# Patient Record
Sex: Female | Born: 2000
Health system: Southern US, Community
[De-identification: ages and names within clinical notes are randomized; demographics above are authoritative.]

## PROBLEM LIST (undated history)

## (undated) DIAGNOSIS — G43909 Migraine, unspecified, not intractable, without status migrainosus: Secondary | ICD-10-CM

## (undated) HISTORY — PX: NO PAST SURGERIES: SHX2092

## (undated) HISTORY — DX: Migraine, unspecified, not intractable, without status migrainosus: G43.909

---
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
## 2018-03-05 ENCOUNTER — Ambulatory Visit: Payer: 59 | Admitting: Neurology

## 2018-03-23 ENCOUNTER — Encounter

## 2018-03-23 ENCOUNTER — Encounter: Payer: Self-pay | Admitting: Neurology

## 2018-03-23 ENCOUNTER — Ambulatory Visit: Payer: 59 | Admitting: Neurology

## 2018-03-23 VITALS — BP 105/63 | HR 61 | Wt 182.0 lb

## 2018-03-23 DIAGNOSIS — R202 Paresthesia of skin: Secondary | ICD-10-CM | POA: Diagnosis not present

## 2018-03-23 NOTE — Progress Notes (Signed)
Reason for visit: Numbness  Referring physician: Dr. Welton Flakes is a 17 y.o. female  History of present illness:  Ms. Turnbaugh is an 17 year old right-handed white female with a history of numbness mainly involving the big toes of both feet that began simultaneously in August 2018.  The patient has had persistent sensory symptoms but the symptoms will wax and wane in severity.  The patient indicates that there is no discomfort with the numbness, she denies any difficulty sleeping at night.  She reports no change in balance and has had no change in strength of the legs.  She reports no numbness elsewhere on the legs or body or arms or hands.  She denies any balance changes or difficulty controlling the bowels or the bladder.  She does not believe that the overall severity of the numbness has worsened over time.  The patient has no family history of peripheral neuropathy issues.  She is followed by Dr. Catalina Lunger for intractable migraine headache.  She reports no association of the numbness in the feet with the migraine.  Past Medical History:  Diagnosis Date  . Migraine     Past Surgical History:  Procedure Laterality Date  . NO PAST SURGERIES      History reviewed. No pertinent family history.  Social history:  has no tobacco, alcohol, and drug history on file.  Medications:  Prior to Admission medications   Medication Sig Start Date End Date Taking? Authorizing Provider  atenolol (TENORMIN) 25 MG tablet Take 25 mg by mouth daily.    Yes [provider]  Metaxalone (SKELAXIN PO) Skelaxin   Yes [provider]  norethindrone-ethinyl estradiol (JUNEL 1/20) 1-20 MG-MCG tablet Junel FE 1/20 (28) 1 mg-20 mcg (21)/75 mg (7) tablet  Take 1 tablet every day by oral route for 28 days.  Can take continuously   Yes [provider]  rizatriptan (MAXALT) 10 MG tablet Take 1 tablet by mouth as needed.   Yes [provider]     No Known  Allergies  ROS:  Out of a complete 14 system review of symptoms, the patient complains only of the following symptoms, and all other reviewed systems are negative.  Numbness  Blood pressure 105/63, pulse 61, weight 182 lb (82.6 kg).  Physical Exam  General: The patient is alert and cooperative at the time of the examination.  Eyes: Pupils are equal, round, and reactive to light. Discs are flat bilaterally.  Good venous pulsations are noted.  Neck: The neck is supple, no carotid bruits are noted.  Respiratory: The respiratory examination is clear.  Cardiovascular: The cardiovascular examination reveals a regular rate and rhythm, no obvious murmurs or rubs are noted.  Skin: Extremities are without significant edema.  Neurologic Exam  Mental status: The patient is alert and oriented x 3 at the time of the examination. The patient has apparent normal recent and remote memory, with an apparently normal attention span and concentration ability.  Cranial nerves: Facial symmetry is present. There is good sensation of the face to pinprick and soft touch bilaterally. The strength of the facial muscles and the muscles to head turning and shoulder shrug are normal bilaterally. Speech is well enunciated, no aphasia or dysarthria is noted. Extraocular movements are full. Visual fields are full. The tongue is midline, and the patient has symmetric elevation of the soft palate. No obvious hearing deficits are noted.  Motor: The motor testing reveals 5 over 5 strength of all 4  extremities. Good symmetric motor tone is noted throughout.  Sensory: Sensory testing is intact to pinprick, soft touch, vibration sensation, and position sense on all 4 extremities, with exception of that there may be a slight stocking pattern sensory change in the distal half of the foot bilaterally. No evidence of extinction is noted.  Coordination: Cerebellar testing reveals good finger-nose-finger and heel-to-shin  bilaterally.  Gait and station: Gait is normal. Tandem gait is normal. Romberg is negative. No drift is seen.  The patient is able to walk on the heels and the toes bilaterally.  Reflexes: Deep tendon reflexes are symmetric and normal bilaterally, with exception that the ankle jerk reflexes may be slightly depressed bilaterally. Toes are downgoing bilaterally.   Assessment/Plan:  1.  Numbness of the toes bilaterally  The patient has isolated numbness of the great toes bilaterally.  Clinical examination is otherwise relatively unremarkable.  The patient will be set up for blood work today, she will have nerve conduction studies done on both legs and EMG on one leg.  She will follow-up for the above study.  If the results of the above evaluation are normal, conservative monitoring of the sensory deficit will be done.  Marlan Palau. Keith Willis MD 03/23/2018 10:03 AM  [facility] [address] [place], [place] [address]  Phone [phone] Fax [phone]

## 2018-03-23 NOTE — Patient Instructions (Signed)
We will get EMG and NCV study.

## 2018-03-25 ENCOUNTER — Telehealth: Payer: Self-pay | Admitting: Neurology

## 2018-03-25 LAB — MULTIPLE MYELOMA PANEL, SERUM
ALBUMIN SERPL ELPH-MCNC: 3.5 g/dL (ref 2.9–4.4)
ALPHA 1: 0.3 g/dL (ref 0.0–0.4)
ALPHA2 GLOB SERPL ELPH-MCNC: 0.9 g/dL (ref 0.4–1.0)
Albumin/Glob SerPl: 1.1 (ref 0.7–1.7)
B-GLOBULIN SERPL ELPH-MCNC: 1.1 g/dL (ref 0.7–1.3)
GLOBULIN, TOTAL: 3.3 g/dL (ref 2.2–3.9)
Gamma Glob SerPl Elph-Mcnc: 1 g/dL (ref 0.4–1.8)
IGG (IMMUNOGLOBIN G), SERUM: 898 mg/dL (ref 549–1584)
IGM (IMMUNOGLOBULIN M), SRM: 141 mg/dL (ref 58–230)
IgA/Immunoglobulin A, Serum: 88 mg/dL (ref 87–352)
Total Protein: 6.8 g/dL (ref 6.0–8.5)

## 2018-03-25 LAB — RHEUMATOID FACTOR

## 2018-03-25 LAB — B. BURGDORFI ANTIBODIES: Lyme IgG/IgM Ab: <0.91 {ISR} (ref 0.00–0.90)

## 2018-03-25 LAB — HIV ANTIBODY (ROUTINE TESTING W REFLEX): HIV Screen 4th Generation wRfx: NONREACTIVE

## 2018-03-25 LAB — ANGIOTENSIN CONVERTING ENZYME: Angio Convert Enzyme: 25 U/L (ref 14–82)

## 2018-03-25 LAB — ANA W/REFLEX: ANA: NEGATIVE

## 2018-03-25 LAB — VITAMIN B12: Vitamin B-12: 522 pg/mL (ref 232–1245)

## 2018-03-25 NOTE — Telephone Encounter (Signed)
Pt mother(on DPR) is asking for a call with blood work results when available please

## 2018-03-26 ENCOUNTER — Telehealth: Payer: Self-pay | Admitting: *Deleted

## 2018-03-26 NOTE — Telephone Encounter (Signed)
Called mother, Aggie Cosierheresa ([place]) about unremarkable labs per CW,MD note. She verbalized understanding and appreciation for all.

## 2018-03-26 NOTE — Telephone Encounter (Signed)
-----   Message from York Spanielharles K Willis, MD sent at 03/25/2018  4:42 PM EDT -----   The blood work results are unremarkable. Please call the patient. Please call the mother with the results. ----- Message ----- From: Nell RangeInterface, [facility] Lab Results In Sent: 03/24/2018   7:41 AM To: York Spanielharles K Willis, MD

## 2018-04-21 ENCOUNTER — Ambulatory Visit: Payer: 59 | Admitting: Neurology

## 2018-04-21 ENCOUNTER — Ambulatory Visit (INDEPENDENT_AMBULATORY_CARE_PROVIDER_SITE_OTHER): Payer: 59 | Admitting: Neurology

## 2018-04-21 ENCOUNTER — Encounter: Payer: Self-pay | Admitting: Neurology

## 2018-04-21 DIAGNOSIS — R202 Paresthesia of skin: Secondary | ICD-10-CM | POA: Diagnosis not present

## 2018-04-21 NOTE — Procedures (Signed)
     HISTORY:  Wendy Chang is a 17 year old patient with a history of numbness in the big toes bilaterally.  The patient denies any back pain or pain down the legs.  She is being evaluated for a possible neuropathy.  NERVE CONDUCTION STUDIES:  Nerve conduction studies were performed on both lower extremities. The distal motor latencies and motor amplitudes for the peroneal and posterior tibial nerves were within normal limits. The nerve conduction velocities for these nerves were also normal. The sensory latencies for the peroneal and sural nerves were within normal limits. The F wave latencies for the posterior tibial nerves were within normal limits.   EMG STUDIES:  EMG study was performed on the right lower extremity:  The tibialis anterior muscle reveals 2 to 4K motor units with full recruitment. No fibrillations or positive waves were seen. The peroneus tertius muscle reveals 2 to 4K motor units with full recruitment. No fibrillations or positive waves were seen. The medial gastrocnemius muscle reveals 1 to 3K motor units with full recruitment. No fibrillations or positive waves were seen. The vastus lateralis muscle reveals 2 to 4K motor units with full recruitment. No fibrillations or positive waves were seen. The iliopsoas muscle reveals 2 to 4K motor units with full recruitment. No fibrillations or positive waves were seen. The biceps femoris muscle (long head) reveals 2 to 4K motor units with full recruitment. No fibrillations or positive waves were seen. The lumbosacral paraspinal muscles were tested at 3 levels, and revealed no abnormalities of insertional activity at all 3 levels tested. There was good relaxation.  IMPRESSION:  Nerve conduction studies done on both lower extremities were within normal limits.  No evidence of a neuropathy is seen.  EMG evaluation of the right lower extremity was normal, no evidence of a lumbar radiculopathy is seen.  Wendy Chang. Keith Javana Schey  MD 04/21/2018 4:31 PM  [facility] [address] [place], [place] [address]  Phone [phone] Fax [phone]

## 2018-04-21 NOTE — Progress Notes (Addendum)
The patient comes in today for EMG nerve conduction study evaluation.  The nerve conduction studies were normal, EMG on the right leg was normal.  The patient will monitor her sensory symptoms, if any changes are noted, she is to contact our office.   MNC    Nerve / Sites Muscle Latency Ref. Amplitude Ref. Rel Amp Segments Distance Velocity Ref. Area    ms ms mV mV %  cm m/s m/s mVms  R Peroneal - EDB     Ankle EDB 3.8 ?6.5 6.6 ?2.0 100 Ankle - EDB 9   21.6     Fib head EDB 9.6  5.9  89.4 Fib head - Ankle 32 55 ?44 19.9     Pop fossa EDB 11.6  5.3  90.4 Pop fossa - Fib head 10 51 ?44 18.4         Pop fossa - Ankle      L Peroneal - EDB     Ankle EDB 3.8 ?6.5 7.8 ?2.0 100 Ankle - EDB 9   25.0     Fib head EDB 9.7  7.5  95.7 Fib head - Ankle 32 54 ?44 24.2     Pop fossa EDB 11.4  8.4  112 Pop fossa - Fib head 10 58 ?44 29.3     Acc Peron EDB 4.7  0.3  3.26 Pop fossa - Ankle    1.1         Acc Peron - Pop fossa      R Tibial - AH     Ankle AH 3.5 ?5.8 23.5 ?4.0 100 Ankle - AH 9   41.2     Pop fossa AH 11.7  20.0  85 Pop fossa - Ankle 37 46 ?41 39.4  L Tibial - AH     Ankle AH 3.4 ?5.8 22.7 ?4.0 100 Ankle - AH 9   44.6     Pop fossa AH 11.5  18.1  79.7 Pop fossa - Ankle 37 46 ?41 39.5             SNC    Nerve / Sites Rec. Site Peak Lat Ref.  Amp Ref. Segments Distance    ms ms V V  cm  R Sural - Ankle (Calf)     Calf Ankle 3.2 ?4.4 13 ?6 Calf - Ankle 14  L Sural - Ankle (Calf)     Calf Ankle 3.4 ?4.4 12 ?6 Calf - Ankle 14  R Superficial peroneal - Ankle     Lat leg Ankle 3.3 ?4.4 13 ?6 Lat leg - Ankle 14  L Superficial peroneal - Ankle     Lat leg Ankle 3.4 ?4.4 12 ?6 Lat leg - Ankle 14             F  Wave    Nerve F Lat Ref.   ms ms  R Tibial - AH 44.1 ?56.0  L Tibial - AH 43.8 ?56.0         EMG full

## 2018-04-21 NOTE — Progress Notes (Signed)
Please refer to EMG and nerve conduction study procedure note. 

## 2018-06-30 DIAGNOSIS — R69 Illness, unspecified: Secondary | ICD-10-CM | POA: Diagnosis not present

## 2018-07-06 DIAGNOSIS — Z23 Encounter for immunization: Secondary | ICD-10-CM | POA: Diagnosis not present

## 2018-07-12 DIAGNOSIS — R69 Illness, unspecified: Secondary | ICD-10-CM | POA: Diagnosis not present

## 2018-07-18 DIAGNOSIS — R1110 Vomiting, unspecified: Secondary | ICD-10-CM | POA: Diagnosis not present

## 2018-07-18 DIAGNOSIS — G43709 Chronic migraine without aura, not intractable, without status migrainosus: Secondary | ICD-10-CM | POA: Diagnosis not present

## 2018-07-18 DIAGNOSIS — G43909 Migraine, unspecified, not intractable, without status migrainosus: Secondary | ICD-10-CM | POA: Diagnosis not present

## 2018-10-19 DIAGNOSIS — J101 Influenza due to other identified influenza virus with other respiratory manifestations: Secondary | ICD-10-CM | POA: Diagnosis not present

## 2019-10-21 HISTORY — PX: APPENDECTOMY: SHX54

## 2021-11-19 NOTE — Progress Notes (Signed)
[facility]    Provider:  Dr Lucia Gaskins Requesting Provider: Isabella Bowens, [place]* Primary Care Provider:  Marcene Corning, MD  CC:  migraines  HPI:  Wendy Chang is a 21 y.o. female here as requested by Isabella Bowens, PA* for migraines. She saw my colleague Dr. Anne Hahn for numbness of the big toe but that over 3 years ago. Today here for migraines. She has seen neurology Anastasia Fiedler Np in the past at [facility]. I reviewed her notes and medications patient has tried before(see below)  She has had headaches "forever" since the age of 31 or so, Gotten worse over the years. She was on multiple medications. They can be bilaterally or unilateral, pulsating/pounding/thrpobbing/light and sound sensitivity, nausea and vomiting. She also has some less severe headaches that are more tension type but the majority are migraines and moderate to severe 15 days a month and daily headaches. She takes [place] which helps, Rizatriptan helps, trudhessa(does not like side effects), more cervical tightness, cervical myofascial pain, she does not have an aura. No other focal neurologic deficits, associated symptoms, inciting events or modifiable factors.  Reviewed notes, labs and imaging from outside physicians, which showed:  Current and past medications: ANALGESICS: excedrin ANTI-MIGRAINE: imitrex, Maxalt, Ubrelvy HEART/BP: Atenolol (helpful) DECONGESTANT/ANTIHISTAMINE: Benadryl,Sudafed ANTI-NAUSEANT: Zofran NSAIDS:Advil MUSCLE RELAXANTS: Robaxin (somewhat helpful), Zanaflex (no effect), Baclofen (no effect), Skelaxin ANTI-CONVULSANTS: topamax (SE), Zonegran (SE, mood), gabapentin (no help) STEROIDS: Prednisone SLEEPING PILLS/TRANQUILIZERS: ANTI-DEPRESSANTS: imipramine (SE) HERBAL: Mg, B2 FIBROMYALGIA:  HORMONAL: OTHER: Aimovig (not helpful) PROCEDURES FOR HEADACHES:  Review of Systems: Patient complains of symptoms per HPI as well as the following symptoms migraines.  Pertinent negatives and positives per HPI. All others negative.   Social History   Socioeconomic History   Marital status: Single    Spouse name: Not on file   Number of children: 0   Years of education: in college   Highest education level: Not on file  Occupational History   Occupation: Consulting civil engineer  Tobacco Use   Smoking status: Never   Smokeless tobacco: Not on file  Vaping Use   Vaping Use: Never used  Substance and Sexual Activity   Alcohol use: Never   Drug use: Never   Sexual activity: Not on file  Other Topics Concern   Not on file  Social History Narrative   Lives w/ mother and stepdad   Caffeine use: no caffeine    Right handed    Social Determinants of Health   Financial Resource Strain: Not on file  Food Insecurity: Not on file  Transportation Needs: Not on file  Physical Activity: Not on file  Stress: Not on file  Social Connections: Not on file  Intimate Partner Violence: Not on file    Family History  Problem Relation Age of Onset   Migraines Mother    Migraines Maternal Grandmother     Past Medical History:  Diagnosis Date   Migraine     Patient Active Problem List   Diagnosis Date Noted   Intractable chronic migraine without aura and without status migrainosus 11/20/2021    Past Surgical History:  Procedure Laterality Date   NO PAST SURGERIES      Current Outpatient Medications  Medication Sig Dispense Refill   amitriptyline (ELAVIL) 10 MG tablet 20 mg.     Fremanezumab-vfrm (AJOVY) 225 MG/1.5ML SOAJ Inject 225 mg into the skin every 30 (thirty) days. 1.5 mL 11   Fremanezumab-vfrm (AJOVY) 225 MG/1.5ML SOAJ Inject 225 mg into the skin every  30 (thirty) days. 1.5 mL 11   Metaxalone (SKELAXIN PO) Skelaxin     norethindrone-ethinyl estradiol (LOESTRIN) 1-20 MG-MCG tablet Junel FE 1/20 (28) 1 mg-20 mcg (21)/75 mg (7) tablet  Take 1 tablet every day by oral route for 28 days.  Can take continuously     Ubrogepant (UBRELVY) 100 MG TABS Take  100 mg by mouth every 2 (two) hours as needed. Maximum 200mg  a day. 16 tablet 11   rizatriptan (MAXALT) 10 MG tablet Take 1 tablet (10 mg total) by mouth as needed. 9 tablet 11   No current facility-administered medications for this visit.    Allergies as of 11/20/2021   (No Known Allergies)    Vitals: BP 112/84    Pulse 98    Ht 5\' 5"  (1.651 m)    Wt 177 lb (80.3 kg)    SpO2 95%    BMI 29.45 kg/m  Last Weight:  Wt Readings from Last 1 Encounters:  11/20/21 177 lb (80.3 kg)   Last Height:   Ht Readings from Last 1 Encounters:  11/20/21 5\' 5"  (1.651 m)     Physical exam: Exam: Gen: NAD, conversant, well nourised, well groomed                     CV: RRR, no MRG. No Carotid Bruits. No peripheral edema, warm, nontender Eyes: Conjunctivae clear without exudates or hemorrhage  Neuro: Detailed Neurologic Exam  Speech:    Speech is normal; fluent and spontaneous with normal comprehension.  Cognition:    The patient is oriented to person, place, and time;     recent and remote memory intact;     language fluent;     normal attention, concentration,     fund of knowledge Cranial Nerves:    The pupils are equal, round, and reactive to light. The fundi are flat. Visual fields are full to finger confrontation. Extraocular movements are intact. Trigeminal sensation is intact and the muscles of mastication are normal. The face is symmetric. The palate elevates in the midline. Hearing intact. Voice is normal. Shoulder shrug is normal. The tongue has normal motion without fasciculations.   Coordination:    Normal.   Gait:    normal.   Motor Observation:    No asymmetry, no atrophy, and no involuntary movements noted. Tone:    Normal muscle tone.    Posture:    Posture is normal. normal erect    Strength:    Strength is V/V in the upper and lower limbs.      Sensation: intact to LT     Reflex Exam:  DTR's:    Deep tendon reflexes in the upper and lower extremities are  normal bilaterally.   Toes:    The toes are downgoing bilaterally.   Clonus:    Clonus is absent.    Assessment/Plan: This is a 21 year old patient with chronic migraines.  She is tried and failed multiple medications.  We discussed options today extensively, including trying another CGRP medication such as Ajovy or trying Botox.  We discussed the pros and cons.  She was on Aimovig for more than 6 months per patient and was doing well however it stopped working.  -No red flags to get imaging for MRI of the brain, these are her usual migraines, no changes in quality or severity or frequency, ongoing for over a year and since she was 21 years old.  Not positional or exertional.  No vision changes.  We will hold off on MRI of the brain, I discussed with patient and she does not want to get an MRI of the brain, but if anything changes low threshold for MRI.  -Decided to try Ajovy, gave her 2 samples to get started, will see if we can get it approved through her insurance or [facility] if that is more preferred.  Discussed teratogenicity, do not get pregnant on this medication or for 6 months after stopping it.  Use birth control and backup.  Patient acknowledged risks.  She is not intending on getting pregnant anytime soon and she does use birth control consistently.  -Would try Botox next  -Ubrelvy and Maxalt work acutely, refilled.  Meds ordered this encounter  Medications   rizatriptan (MAXALT) 10 MG tablet    Sig: Take 1 tablet (10 mg total) by mouth as needed.    Dispense:  9 tablet    Refill:  11   Ubrogepant (UBRELVY) 100 MG TABS    Sig: Take 100 mg by mouth every 2 (two) hours as needed. Maximum 200mg  a day.    Dispense:  16 tablet    Refill:  11   Fremanezumab-vfrm (AJOVY) 225 MG/1.5ML SOAJ    Sig: Inject 225 mg into the skin every 30 (thirty) days.    Dispense:  1.5 mL    Refill:  11    [id number] 04/2024   Fremanezumab-vfrm (AJOVY) 225 MG/1.5ML SOAJ    Sig: Inject 225 mg  into the skin every 30 (thirty) days.    Dispense:  1.5 mL    Refill:  11    Cc: Isabella BowensWalke, Devyn Leeann, PA*,  Marcene Corningwiselton, Louise, MD  Naomie DeanAntonia Francely Craw, MD  [facility] [address] [place], [place] [id number]  Phone [phone] Fax [phone]  I spent over 60 minutes of face-to-face and non-face-to-face time with patient on the  1. Intractable chronic migraine without aura and without status migrainosus    diagnosis.  This included previsit chart review, lab review, study review, order entry, electronic health record documentation, patient education on the different diagnostic and therapeutic options, counseling and coordination of care, risks and benefits of management, compliance, or risk factor reduction

## 2021-11-20 ENCOUNTER — Ambulatory Visit (INDEPENDENT_AMBULATORY_CARE_PROVIDER_SITE_OTHER): Payer: No Typology Code available for payment source | Admitting: Neurology

## 2021-11-20 ENCOUNTER — Encounter: Payer: Self-pay | Admitting: Neurology

## 2021-11-20 ENCOUNTER — Other Ambulatory Visit: Payer: Self-pay

## 2021-11-20 VITALS — BP 112/84 | HR 98 | Ht 65.0 in | Wt 177.0 lb

## 2021-11-20 DIAGNOSIS — G43719 Chronic migraine without aura, intractable, without status migrainosus: Secondary | ICD-10-CM

## 2021-11-20 MED ORDER — AJOVY 225 MG/1.5ML ~~LOC~~ SOAJ: 225.0000 mg | SUBCUTANEOUS | 11 refills | Status: DC

## 2021-11-20 MED ORDER — UBRELVY 100 MG PO TABS
100.0000 mg | ORAL_TABLET | ORAL | 11 refills | Status: DC | PRN
Start: 2021-11-20 — End: 2021-12-30

## 2021-11-20 MED ORDER — RIZATRIPTAN BENZOATE 10 MG PO TABS: 10.0000 mg | ORAL_TABLET | ORAL | 11 refills | Status: DC | PRN

## 2021-11-20 NOTE — Patient Instructions (Signed)
Start Ajovy Discussed Botox Continue Ubrelvy and Maxalt/Rizatriptan as needed  Ubrogepant tablets What is this medication? UBROGEPANT (ue BROE je pant) is used to treat migraine headaches with or without aura. An aura is a strange feeling or visual disturbance that warns you of an attack. It is not used to prevent migraines. This medicine may be used for other purposes; ask your health care provider or pharmacist if you have questions. COMMON BRAND NAME(S): Roselyn Meier What should I tell my care team before I take this medication? They need to know if you have any of these conditions: kidney disease liver disease an unusual or allergic reaction to ubrogepant, other medicines, foods, dyes, or preservatives pregnant or trying to get pregnant breast-feeding How should I use this medication? Take this medicine by mouth with a glass of water. Follow the directions on the prescription label. You can take it with or without food. If it upsets your stomach, take it with food. Take your medicine at regular intervals. Do not take it more often than directed. Do not stop taking except on your doctor's advice. Talk to your pediatrician about the use of this medicine in children. Special care may be needed. Overdosage: If you think you have taken too much of this medicine contact a poison control center or emergency room at once. NOTE: This medicine is only for you. Do not share this medicine with others. What if I miss a dose? This does not apply. This medicine is not for regular use. What may interact with this medication? Do not take this medicine with any of the following medicines: ceritinib certain antibiotics like chloramphenicol, clarithromycin, telithromycin certain antivirals for HIV like atazanavir, cobicistat, darunavir, delavirdine, fosamprenavir, indinavir, ritonavir certain medicines for fungal infections like itraconazole, ketoconazole, posaconazole,  voriconazole conivaptan grapefruit idelalisib mifepristone nefazodone ribociclib This medicine may also interact with the following medications: carvedilol certain medicines for seizures like phenobarbital, phenytoin ciprofloxacin cyclosporine eltrombopag fluconazole fluvoxamine quinidine rifampin St. John's wort verapamil This list may not describe all possible interactions. Give your health care provider a list of all the medicines, herbs, non-prescription drugs, or dietary supplements you use. Also tell them if you smoke, drink alcohol, or use illegal drugs. Some items may interact with your medicine. What should I watch for while using this medication? Visit your health care professional for regular checks on your progress. Tell your health care professional if your symptoms do not start to get better or if they get worse. Your mouth may get dry. Chewing sugarless gum or sucking hard candy and drinking plenty of water may help. Contact your health care professional if the problem does not go away or is severe. What side effects may I notice from receiving this medication? Side effects that you should report to your doctor or health care professional as soon as possible: allergic reactions like skin rash, itching or hives; swelling of the face, lips, or tongue Side effects that usually do not require medical attention (report these to your doctor or health care professional if they continue or are bothersome): drowsiness dry mouth nausea tiredness This list may not describe all possible side effects. Call your doctor for medical advice about side effects. You may report side effects to FDA at 1-800-FDA-1088. Where should I keep my medication? Keep out of the reach of children. Store at room temperature between 15 and 30 degrees C (59 and 86 degrees F). Throw away any unused medicine after the expiration date. NOTE: This sheet is a summary. It may  not cover all possible  information. If you have questions about this medicine, talk to your doctor, pharmacist, or health care provider.  2022 Elsevier/Gold Standard (2018-12-23 00:00:00) OnabotulinumtoxinA injection (Medical Use) What is this medication? ONABOTULINUMTOXINA (o na BOTT you lye num tox in eh) is a neuro-muscular blocker. This medicine is used to treat crossed eyes, eyelid spasms, severe neck muscle spasms, ankle and toe muscle spasms, and elbow, wrist, and finger muscle spasms. It is also used to treat excessive underarm sweating, to prevent chronic migraine headaches, and to treat loss of bladder control due to neurologic conditions such as multiple sclerosis or spinal cord injury. This medicine may be used for other purposes; ask your health care provider or pharmacist if you have questions. COMMON BRAND NAME(S): Botox What should I tell my care team before I take this medication? They need to know if you have any of these conditions: breathing problems cerebral palsy spasms difficulty urinating heart problems history of surgery where this medicine is going to be used infection at the site where this medicine is going to be used myasthenia gravis or other neurologic disease nerve or muscle disease surgery plans take medicines that treat or prevent blood clots thyroid problems an unusual or allergic reaction to botulinum toxin, albumin, other medicines, foods, dyes, or preservatives pregnant or trying to get pregnant breast-feeding How should I use this medication? This medicine is for injection into a muscle. It is given by a health care professional in a hospital or clinic setting. Talk to your pediatrician regarding the use of this medicine in children. While this drug may be prescribed for children as young as 13 years old for selected conditions, precautions do apply. Overdosage: If you think you have taken too much of this medicine contact a poison control center or emergency room at  once. NOTE: This medicine is only for you. Do not share this medicine with others. What if I miss a dose? This does not apply. What may interact with this medication? aminoglycoside antibiotics like gentamicin, neomycin, tobramycin muscle relaxants other botulinum toxin injections This list may not describe all possible interactions. Give your health care provider a list of all the medicines, herbs, non-prescription drugs, or dietary supplements you use. Also tell them if you smoke, drink alcohol, or use illegal drugs. Some items may interact with your medicine. What should I watch for while using this medication? Visit your doctor for regular check ups. This medicine will cause weakness in the muscle where it is injected. Tell your doctor if you feel unusually weak in other muscles. Get medical help right away if you have problems with breathing, swallowing, or talking. This medicine might make your eyelids droop or make you see blurry or double. If you have weak muscles or trouble seeing do not drive a car, use machinery, or do other dangerous activities. This medicine contains albumin from human blood. It may be possible to pass an infection in this medicine, but no cases have been reported. Talk to your doctor about the risks and benefits of this medicine. If your activities have been limited by your condition, go back to your regular routine slowly after treatment with this medicine. What side effects may I notice from receiving this medication? Side effects that you should report to your doctor or health care professional as soon as possible: allergic reactions like skin rash, itching or hives, swelling of the face, lips, or tongue breathing problems changes in vision chest pain or tightness eye irritation, pain  fast, irregular heartbeat infection numbness speech problems swallowing problems unusual weakness Side effects that usually do not require medical attention (report to your  doctor or health care professional if they continue or are bothersome): bruising or pain at site where injected drooping eyelid dry eyes or mouth headache muscles aches, pains sensitivity to light tearing This list may not describe all possible side effects. Call your doctor for medical advice about side effects. You may report side effects to FDA at 1-800-FDA-1088. Where should I keep my medication? This drug is given in a hospital or clinic and will not be stored at home. NOTE: This sheet is a summary. It may not cover all possible information. If you have questions about this medicine, talk to your doctor, pharmacist, or health care provider.  2022 Elsevier/Gold Standard (2018-04-19 00:00:00)

## 2021-11-26 ENCOUNTER — Telehealth: Payer: Self-pay | Admitting: Neurology

## 2021-11-26 NOTE — Telephone Encounter (Signed)
PA completed on [facility] [facility] [id number] Approved immediately  CaseId:[id number];Status:Approved;Review Type:Prior Auth;Coverage Start Date:10/27/2021;Coverage End Date:11/26/2022;

## 2021-12-30 ENCOUNTER — Telehealth: Payer: Self-pay | Admitting: Neurology

## 2021-12-30 MED ORDER — UBRELVY 100 MG PO TABS: 100.0000 mg | ORAL_TABLET | ORAL | 11 refills | Status: DC | PRN

## 2021-12-30 NOTE — Addendum Note (Signed)
Addended by: Bertram Savin on: 12/30/2021 09:39 AM ? ? Modules accepted: Orders ? ?

## 2021-12-30 NOTE — Telephone Encounter (Signed)
Spoke with patient. Will try [facility]. Pt was given the number for the pharmacy if she doesn't hear from them within 24 hours. Pt verbalized appreciation.  ? ?Wendy Chang Rx e-scribed to [facility].  ?

## 2021-12-30 NOTE — Telephone Encounter (Signed)
Pt has called to report that [facility] [phone] is only able to get the Ubrogepant (UBRELVY) 100 MG TABS in 10 pill increments and unable to break up, they are asking if Dr Lucia Gaskins will allow 20 to be called in instead of 16  ?

## 2021-12-30 NOTE — Telephone Encounter (Signed)
[facility] pharmacy can likely give her the 16 tablets. Insurance will not approve 20 tablets of Ubrelvy.  ?

## 2022-01-23 ENCOUNTER — Other Ambulatory Visit: Payer: Self-pay | Admitting: Neurology

## 2022-01-23 DIAGNOSIS — G43719 Chronic migraine without aura, intractable, without status migrainosus: Secondary | ICD-10-CM

## 2022-03-10 ENCOUNTER — Telehealth: Payer: Self-pay

## 2022-03-10 MED ORDER — AMITRIPTYLINE HCL 10 MG PO TABS: 20.0000 mg | ORAL_TABLET | Freq: Every day | ORAL | 5 refills | Status: DC

## 2022-03-10 NOTE — Telephone Encounter (Signed)
Patient left a voicemail this morning asking for a refill on an unnamed medication.  Please call patient.

## 2022-03-10 NOTE — Telephone Encounter (Signed)
Per Dr Lucia Gaskins, ok to refill the Amitriptyline.

## 2022-03-10 NOTE — Telephone Encounter (Signed)
Spoke with the patient.  She states she was referring to Amitriptyline.  She was getting this previously from another physician before she saw Dr. Jaynee Eagles.  She takes two 10 mg tablets at night for total of 20 mg.  She states she is afraid to come off of it at this time and would like to continue it.

## 2022-03-11 ENCOUNTER — Telehealth: Payer: Self-pay | Admitting: *Deleted

## 2022-03-11 NOTE — Telephone Encounter (Signed)
Completed Bernita Raisin PA on Cover [employer] Med[employer]. Key: [id number] - PA Case ID: [id number] - Rx #: [id number]  Approved today by [facility] CaseId:[id number];Status:Approved;Review Type:Prior Auth;Coverage Start Date:02/09/2022;Coverage End Date:03/11/2023;  [facility] pharmacy has been notified via fax. Received a receipt of confirmation.

## 2022-05-12 ENCOUNTER — Encounter: Payer: Self-pay | Admitting: Neurology

## 2022-05-12 DIAGNOSIS — G43719 Chronic migraine without aura, intractable, without status migrainosus: Secondary | ICD-10-CM

## 2022-05-13 MED ORDER — EMGALITY 120 MG/ML ~~LOC~~ SOAJ: 120.0000 mg | SUBCUTANEOUS | 5 refills | Status: DC

## 2022-05-14 ENCOUNTER — Other Ambulatory Visit: Payer: Self-pay | Admitting: *Deleted

## 2022-05-14 ENCOUNTER — Telehealth: Payer: Self-pay | Admitting: *Deleted

## 2022-05-14 NOTE — Telephone Encounter (Signed)
Completed Emgality PA on Cover My Meds. Key: [id number]. Approved immediately.  CaseId: [id number]; Status: Approved; Review Type: Prior Auth; Coverage Start Date: 04/14/2022; Coverage End Date: 05/14/2023;

## 2022-08-21 ENCOUNTER — Encounter: Payer: Self-pay | Admitting: Neurology

## 2022-08-21 NOTE — Telephone Encounter (Signed)
I called [facility] pharmacy. I was told the patient had maxed out her savings card. I called the pt and advised her to download a new savings card and give that info to the pharmacy then let us know if it doesn't work. Pt verbalized appreciation and her questions were answered.

## 2022-08-27 MED ORDER — UBRELVY 100 MG PO TABS: 100.0000 mg | ORAL_TABLET | ORAL | 11 refills | Status: DC | PRN

## 2022-08-27 NOTE — Addendum Note (Signed)
Addended by: Bertram Savin on: 08/27/2022 01:03 PM   Modules accepted: Orders

## 2022-09-12 ENCOUNTER — Other Ambulatory Visit: Payer: Self-pay | Admitting: Neurology

## 2022-09-17 ENCOUNTER — Other Ambulatory Visit: Payer: Self-pay | Admitting: Neurology

## 2022-09-29 ENCOUNTER — Telehealth: Payer: Self-pay | Admitting: Neurology

## 2022-09-29 DIAGNOSIS — G43719 Chronic migraine without aura, intractable, without status migrainosus: Secondary | ICD-10-CM

## 2022-09-29 MED ORDER — RIZATRIPTAN BENZOATE 10 MG PO TABS: 10.0000 mg | ORAL_TABLET | ORAL | 2 refills | Status: DC | PRN

## 2022-09-29 NOTE — Telephone Encounter (Signed)
Rx refill sent to [facility] [phone]

## 2022-09-29 NOTE — Telephone Encounter (Signed)
Pt request refill for rizatriptan (MAXALT) 10 MG tablet at [facility] #[id number]

## 2022-11-05 ENCOUNTER — Encounter: Payer: Self-pay | Admitting: Neurology

## 2022-11-11 NOTE — Telephone Encounter (Signed)
I changed her 2/6 appointment from Dr. Jaynee Eagles to [facility].

## 2022-11-18 ENCOUNTER — Other Ambulatory Visit: Payer: Self-pay | Admitting: Neurology

## 2022-11-18 DIAGNOSIS — G43719 Chronic migraine without aura, intractable, without status migrainosus: Secondary | ICD-10-CM

## 2022-11-24 NOTE — Patient Instructions (Incomplete)
Below is our plan:  We will continue Emgality, rizatriptan and Ubrelvy. We will work on getting Botox covered. Please keep an eye on your blood pressure at home. Let PCP know if readings are consistently over 140/90.   Please make sure you are staying well hydrated. I recommend 50-60 ounces daily. Well balanced diet and regular exercise encouraged. Consistent sleep schedule with 6-8 hours recommended.   Please continue follow up with care team as directed.   Follow up with me pending Botox approval.   You may receive a survey regarding today's visit. I encourage you to leave honest feed back as I do use this information to improve patient care. Thank you for seeing me today!

## 2022-11-24 NOTE — Progress Notes (Unsigned)
No chief complaint on file.   HISTORY OF PRESENT ILLNESS:  11/24/22 ALL:  Wendy Chang is a 22 y.o. female here today for follow up for migraines. She was last seen by Dr Jaynee Eagles 11/2021 and started on Ajovy. Rizatriptan used for abortive therapy. She called in 04/2022 reporting worsening headaches and switched to [facility]. Since,   Medications tried and failed: Ajovy, Amovig, topiramate, zonisamide, gabapentin, imipramine, atenolol, Ubrelvy, rizatriptan, Trudessa, sumatriptan, baclofen, robaxin, tizanidine, benadryl, magnesium  HISTORY (copied from Dr Cathren Laine previous note)  HPI:  Wendy Chang is a 21 y.o. female here as requested by Osie Cheeks, PA* for migraines. She saw my colleague Dr. Jannifer Franklin for numbness of the big toe but that over 3 years ago. Today here for migraines. She has seen neurology Ashok Pall Np in the past at [facility]. I reviewed her notes and medications patient has tried before(see below)   She has had headaches "forever" since the age of 45 or so, Gotten worse over the years. She was on multiple medications. They can be bilaterally or unilateral, pulsating/pounding/thrpobbing/light and sound sensitivity, nausea and vomiting. She also has some less severe headaches that are more tension type but the majority are migraines and moderate to severe 15 days a month and daily headaches. She takes [place] which helps, Rizatriptan helps, trudhessa(does not like side effects), more cervical tightness, cervical myofascial pain, she does not have an aura. No other focal neurologic deficits, associated symptoms, inciting events or modifiable factors.   Reviewed notes, labs and imaging from outside physicians, which showed:   Current and past medications: ANALGESICS: excedrin ANTI-MIGRAINE: imitrex, Maxalt, Ubrelvy HEART/BP: Atenolol (helpful) DECONGESTANT/ANTIHISTAMINE: Benadryl,Sudafed ANTI-NAUSEANT: Zofran NSAIDS:Advil MUSCLE RELAXANTS: Robaxin (somewhat helpful),  Zanaflex (no effect), Baclofen (no effect), Skelaxin ANTI-CONVULSANTS: topamax (SE), Zonegran (SE, mood), gabapentin (no help) STEROIDS: Prednisone SLEEPING PILLS/TRANQUILIZERS: ANTI-DEPRESSANTS: imipramine (SE) HERBAL: Mg, B2 FIBROMYALGIA:  HORMONAL: OTHER: Aimovig (not helpful)   REVIEW OF SYSTEMS: Out of a complete 14 system review of symptoms, the patient complains only of the following symptoms, and all other reviewed systems are negative.   ALLERGIES: No Known Allergies   HOME MEDICATIONS: Outpatient Medications Prior to Visit  Medication Sig Dispense Refill   amitriptyline (ELAVIL) 10 MG tablet TAKE 2 TABLETS(20 MG) BY MOUTH AT BEDTIME 60 tablet 2   EMGALITY 120 MG/ML SOAJ INJECT 120MG  INTO THE SKIN EVERY 30 DAYS AS DIRECTED 1 mL 5   Metaxalone (SKELAXIN PO) Skelaxin     norethindrone-ethinyl estradiol (LOESTRIN) 1-20 MG-MCG tablet Junel FE 1/20 (28) 1 mg-20 mcg (21)/75 mg (7) tablet  Take 1 tablet every day by oral route for 28 days.  Can take continuously     rizatriptan (MAXALT) 10 MG tablet Take 1 tablet (10 mg total) by mouth as needed. 9 tablet 2   Ubrogepant (UBRELVY) 100 MG TABS Take 100 mg by mouth every 2 (two) hours as needed. Maximum 200mg  a day. 16 tablet 11   No facility-administered medications prior to visit.     PAST MEDICAL HISTORY: Past Medical History:  Diagnosis Date   Migraine      PAST SURGICAL HISTORY: Past Surgical History:  Procedure Laterality Date   NO PAST SURGERIES       FAMILY HISTORY: Family History  Problem Relation Age of Onset   Migraines Mother    Migraines Maternal Grandmother      SOCIAL HISTORY: Social History   Socioeconomic History   Marital status: Single    Spouse name: Not on  file   Number of children: 0   Years of education: in college   Highest education level: Not on file  Occupational History   Occupation: Student  Tobacco Use   Smoking status: Never   Smokeless tobacco: Not on file  Vaping  Use   Vaping Use: Never used  Substance and Sexual Activity   Alcohol use: Never   Drug use: Never   Sexual activity: Not on file  Other Topics Concern   Not on file  Social History Narrative   Lives w/ mother and stepdad   Caffeine use: no caffeine    Right handed    Social Determinants of Health   Financial Resource Strain: Not on file  Food Insecurity: Not on file  Transportation Needs: Not on file  Physical Activity: Not on file  Stress: Not on file  Social Connections: Not on file  Intimate Partner Violence: Not on file     PHYSICAL EXAM  There were no vitals filed for this visit. There is no height or weight on file to calculate BMI.  Generalized: Well developed, in no acute distress  Cardiology: normal rate and rhythm, no murmur auscultated  Respiratory: clear to auscultation bilaterally    Neurological examination  Mentation: Alert oriented to time, place, history taking. Follows all commands speech and language fluent Cranial nerve II-XII: Pupils were equal round reactive to light. Extraocular movements were full, visual field were full on confrontational test. Facial sensation and strength were normal. Uvula tongue midline. Head turning and shoulder shrug  were normal and symmetric. Motor: The motor testing reveals 5 over 5 strength of all 4 extremities. Good symmetric motor tone is noted throughout.  Sensory: Sensory testing is intact to soft touch on all 4 extremities. No evidence of extinction is noted.  Coordination: Cerebellar testing reveals good finger-nose-finger and heel-to-shin bilaterally.  Gait and station: Gait is normal. Tandem gait is normal. Romberg is negative. No drift is seen.  Reflexes: Deep tendon reflexes are symmetric and normal bilaterally.    DIAGNOSTIC DATA (LABS, IMAGING, TESTING) - I reviewed patient records, labs, notes, testing and imaging myself where available.  No results found for: "WBC", "HGB", "HCT", "MCV", "PLT"     Component Value Date/Time   PROT 6.8 03/23/2018 1005   No results found for: "CHOL", "HDL", "LDLCALC", "LDLDIRECT", "TRIG", "CHOLHDL" No results found for: "HGBA1C" Lab Results  Component Value Date   VITAMINB12 522 03/23/2018   No results found for: "TSH"      No data to display               No data to display           ASSESSMENT AND PLAN  22 y.o. year old female  has a past medical history of Migraine. here with    No diagnosis found.  Jenita Seashore ***.  Healthy lifestyle habits encouraged. *** will follow up with PCP as directed. *** will return to see me in ***, sooner if needed. *** verbalizes understanding and agreement with this plan.   No orders of the defined types were placed in this encounter.    No orders of the defined types were placed in this encounter.    Debbora Presto, MSN, FNP-C 11/24/2022, 12:41 PM  [facility] [address] [place], [place] [address] [phone]

## 2022-11-25 ENCOUNTER — Ambulatory Visit (INDEPENDENT_AMBULATORY_CARE_PROVIDER_SITE_OTHER): Payer: No Typology Code available for payment source | Admitting: Family Medicine

## 2022-11-25 ENCOUNTER — Ambulatory Visit: Payer: No Typology Code available for payment source | Admitting: Neurology

## 2022-11-25 ENCOUNTER — Encounter: Payer: Self-pay | Admitting: Family Medicine

## 2022-11-25 ENCOUNTER — Telehealth: Payer: Self-pay | Admitting: *Deleted

## 2022-11-25 VITALS — BP 148/96 | HR 68 | Ht 65.0 in | Wt 179.5 lb

## 2022-11-25 DIAGNOSIS — G43719 Chronic migraine without aura, intractable, without status migrainosus: Secondary | ICD-10-CM

## 2022-11-25 NOTE — Telephone Encounter (Signed)
Needs Botox auth. New start.  Chronic Migraine CPT 64615  Botox J0585 Units:200  G43.719 Intractable chronic migraine without aura and without status migrainosus   

## 2022-12-04 ENCOUNTER — Other Ambulatory Visit (HOSPITAL_COMMUNITY): Payer: Self-pay

## 2022-12-04 NOTE — Telephone Encounter (Signed)
Pharmacy Patient Advocate Encounter   Received notification from [employer] that prior authorization for Botox 200UNIT solution is required/requested.    PA submitted on 12/04/2022 to [facility] [employer]  via CoverMyMeds Key [id number] Status is pending

## 2022-12-04 NOTE — Telephone Encounter (Signed)
  Benefit Verification [id number] Submitted!

## 2022-12-08 ENCOUNTER — Other Ambulatory Visit (HOSPITAL_COMMUNITY): Payer: Self-pay

## 2022-12-08 ENCOUNTER — Other Ambulatory Visit: Payer: Self-pay

## 2022-12-08 MED ORDER — ONABOTULINUMTOXINA 200 UNITS IJ SOLR
INTRAMUSCULAR | 2 refills | Status: DC
  Filled 2022-12-08: qty 1, fill #0
  Filled 2022-12-09: qty 1, 84d supply, fill #0
  Filled 2023-03-18: qty 1, 84d supply, fill #1
  Filled 2023-08-28: qty 1, 84d supply, fill #2

## 2022-12-08 NOTE — Telephone Encounter (Signed)
Pt scheduled for botox injection for 01/01/23 at 3:30pm

## 2022-12-08 NOTE — Telephone Encounter (Signed)
Pharmacy Patient Advocate Encounter- Botox BIV-Pharmacy Benefit:  PA was submitted to [employer] [facility] and has been approved through: 12/04/2023 Authorization# PA Case ID: [id number]  Please send prescription to Specialty Pharmacy: [facility]: [phone]  Estimated Copay is: $440.76  Patient Is eligible for Botox Copay Card, which will make patient's copay as little as zero. Copay card will be provided to pharmacy.

## 2022-12-08 NOTE — Addendum Note (Signed)
Addended by: Wyvonnia Lora on: 12/08/2022 03:27 PM   Modules accepted: Orders

## 2022-12-09 ENCOUNTER — Other Ambulatory Visit: Payer: Self-pay

## 2022-12-09 ENCOUNTER — Other Ambulatory Visit (HOSPITAL_COMMUNITY): Payer: Self-pay

## 2022-12-15 ENCOUNTER — Encounter: Payer: Self-pay | Admitting: Family Medicine

## 2022-12-15 ENCOUNTER — Other Ambulatory Visit: Payer: Self-pay | Admitting: *Deleted

## 2022-12-15 MED ORDER — UBRELVY 100 MG PO TABS: 100.0000 mg | ORAL_TABLET | ORAL | 11 refills | Status: DC | PRN

## 2022-12-17 ENCOUNTER — Other Ambulatory Visit: Payer: Self-pay | Admitting: Neurology

## 2022-12-17 ENCOUNTER — Other Ambulatory Visit: Payer: Self-pay | Admitting: *Deleted

## 2022-12-17 MED ORDER — RIZATRIPTAN BENZOATE 10 MG PO TABS: 10.0000 mg | ORAL_TABLET | ORAL | 2 refills | Status: DC | PRN

## 2022-12-24 ENCOUNTER — Other Ambulatory Visit (HOSPITAL_COMMUNITY): Payer: Self-pay

## 2022-12-29 ENCOUNTER — Other Ambulatory Visit (HOSPITAL_COMMUNITY): Payer: Self-pay

## 2022-12-30 NOTE — Progress Notes (Unsigned)
12/30/22 ALL: Wendy Chang presents for first Botox procedure. She has continued Emgality, amitriptyline, Ubrelvy and rizatriptan but continues to have daily migraines.     Consent Form Botulism Toxin Injection For Chronic Migraine    Reviewed orally with patient, additionally signature is on file:  Botulism toxin has been approved by the Federal drug administration for treatment of chronic migraine. Botulism toxin does not cure chronic migraine and it may not be effective in some patients.  The administration of botulism toxin is accomplished by injecting a small amount of toxin into the muscles of the neck and head. Dosage must be titrated for each individual. Any benefits resulting from botulism toxin tend to wear off after 3 months with a repeat injection required if benefit is to be maintained. Injections are usually done every 3-4 months with maximum effect peak achieved by about 2 or 3 weeks. Botulism toxin is expensive and you should be sure of what costs you will incur resulting from the injection.  The side effects of botulism toxin use for chronic migraine may include:   -Transient, and usually mild, facial weakness with facial injections  -Transient, and usually mild, head or neck weakness with head/neck injections  -Reduction or loss of forehead facial animation due to forehead muscle weakness  -Eyelid drooping  -Dry eye  -Pain at the site of injection or bruising at the site of injection  -Double vision  -Potential unknown long term risks   Contraindications: You should not have Botox if you are pregnant, nursing, allergic to albumin, have an infection, skin condition, or muscle weakness at the site of the injection, or have myasthenia gravis, Lambert-Eaton syndrome, or ALS.  It is also possible that as with any injection, there may be an allergic reaction or no effect from the medication. Reduced effectiveness after repeated injections is sometimes seen and rarely infection  at the injection site may occur. All care will be taken to prevent these side effects. If therapy is given over a long time, atrophy and wasting in the muscle injected may occur. Occasionally the patient's become refractory to treatment because they develop antibodies to the toxin. In this event, therapy needs to be modified.  I have read the above information and consent to the administration of botulism toxin.    BOTOX PROCEDURE NOTE FOR MIGRAINE HEADACHE  Contraindications and precautions discussed with patient(above). Aseptic procedure was observed and patient tolerated procedure. Procedure performed by Debbora Presto, FNP-C.   The condition has existed for more than 6 months, and pt does not have a diagnosis of ALS, Myasthenia Gravis or Lambert-Eaton Syndrome.  Risks and benefits of injections discussed and pt agrees to proceed with the procedure.  Written consent obtained  These injections are medically necessary. Pt  receives good benefits from these injections. These injections do not cause sedations or hallucinations which the oral therapies may cause.   Description of procedure:  The patient was placed in a sitting position. The standard protocol was used for Botox as follows, with 5 units of Botox injected at each site:  -Procerus muscle, midline injection  -Corrugator muscle, bilateral injection  -Frontalis muscle, bilateral injection, with 2 sites each side, medial injection was performed in the upper one third of the frontalis muscle, in the region vertical from the medial inferior edge of the superior orbital rim. The lateral injection was again in the upper one third of the forehead vertically above the lateral limbus of the cornea, 1.5 cm lateral to the medial injection  site.  -Temporalis muscle injection, 4 sites, bilaterally. The first injection was 3 cm above the tragus of the ear, second injection site was 1.5 cm to 3 cm up from the first injection site in line with the  tragus of the ear. The third injection site was 1.5-3 cm forward between the first 2 injection sites. The fourth injection site was 1.5 cm posterior to the second injection site. 5th site laterally in the temporalis  muscleat the level of the outer canthus.  -Occipitalis muscle injection, 3 sites, bilaterally. The first injection was done one half way between the occipital protuberance and the tip of the mastoid process behind the ear. The second injection site was done lateral and superior to the first, 1 fingerbreadth from the first injection. The third injection site was 1 fingerbreadth superiorly and medially from the first injection site.  -Cervical paraspinal muscle injection, 2 sites, bilaterally. The first injection site was 1 cm from the midline of the cervical spine, 3 cm inferior to the lower border of the occipital protuberance. The second injection site was 1.5 cm superiorly and laterally to the first injection site.  -Trapezius muscle injection was performed at 3 sites, bilaterally. The first injection site was in the upper trapezius muscle halfway between the inflection point of the neck, and the acromion. The second injection site was one half way between the acromion and the first injection site. The third injection was done between the first injection site and the inflection point of the neck.   Will return for repeat injection in 3 months.   A total of 200 units of Botox was prepared, 155 units of Botox was injected as documented above, any Botox not injected was wasted. The patient tolerated the procedure well, there were no complications of the above procedure.

## 2022-12-31 ENCOUNTER — Other Ambulatory Visit (HOSPITAL_COMMUNITY): Payer: Self-pay

## 2023-01-01 ENCOUNTER — Other Ambulatory Visit (HOSPITAL_COMMUNITY): Payer: Self-pay

## 2023-01-01 ENCOUNTER — Ambulatory Visit: Payer: No Typology Code available for payment source | Admitting: Family Medicine

## 2023-01-01 VITALS — BP 138/95 | HR 66 | Ht 65.0 in | Wt 175.2 lb

## 2023-01-01 DIAGNOSIS — G43719 Chronic migraine without aura, intractable, without status migrainosus: Secondary | ICD-10-CM

## 2023-01-01 MED ORDER — ONABOTULINUMTOXINA 200 UNITS IJ SOLR
155.0000 [IU] | Freq: Once | INTRAMUSCULAR | Status: AC
  Administered 2023-01-01: 155 [IU] via INTRAMUSCULAR

## 2023-01-01 MED ORDER — ONDANSETRON HCL 4 MG PO TABS: 4.0000 mg | ORAL_TABLET | Freq: Three times a day (TID) | ORAL | 5 refills | Status: AC | PRN

## 2023-01-01 NOTE — Progress Notes (Signed)
Botox-200U x 1vial Lot: [id number] Expiration: 03/2025 NDC: [id number]  Bacteriostatic 0.9% Sodium Chloride- 20m total [id number]:08/2024 NDC: [id number] SP, witnessed by JAllyson Sabal

## 2023-03-02 ENCOUNTER — Telehealth: Payer: Self-pay | Admitting: Family Medicine

## 2023-03-02 MED ORDER — RIZATRIPTAN BENZOATE 10 MG PO TABS: 10.0000 mg | ORAL_TABLET | ORAL | 1 refills | Status: DC | PRN

## 2023-03-02 NOTE — Telephone Encounter (Signed)
Pt requesting a refill on rizatriptan (MAXALT) 10 MG tablet. Should be sent to [facility] #[id number]

## 2023-03-02 NOTE — Addendum Note (Signed)
Addended by: Aura Camps on: 03/02/2023 04:43 PM   Modules accepted: Orders

## 2023-03-02 NOTE — Telephone Encounter (Addendum)
Pt last seen on 11/25/22 for office visit  per note "We will have her continue Emgality, rizatriptan and Ubrelvy as prescribed. "  Follow up scheduled on 04/08/23   Last filled on 02/10/23 # 9 tablets (9 day supply)

## 2023-03-02 NOTE — Telephone Encounter (Signed)
Rx sent 

## 2023-03-11 ENCOUNTER — Telehealth: Payer: Self-pay | Admitting: Pharmacy Technician

## 2023-03-11 NOTE — Telephone Encounter (Signed)
Patient Advocate Encounter  Prior Authorization for Bernita Raisin 100MG  tablets has been approved.    PA# [id number] Key: [employer] Electronic PA Form Effective dates: 03/11/2023 through 03/10/2024

## 2023-03-18 ENCOUNTER — Other Ambulatory Visit (HOSPITAL_COMMUNITY): Payer: Self-pay

## 2023-03-31 ENCOUNTER — Ambulatory Visit: Payer: No Typology Code available for payment source | Admitting: Family Medicine

## 2023-04-02 NOTE — Telephone Encounter (Signed)
Received (1) 200u vial of Botox from [facility].

## 2023-04-08 ENCOUNTER — Ambulatory Visit: Payer: No Typology Code available for payment source | Admitting: Family Medicine

## 2023-04-09 ENCOUNTER — Ambulatory Visit: Payer: No Typology Code available for payment source | Admitting: Family Medicine

## 2023-04-13 ENCOUNTER — Telehealth: Payer: Self-pay | Admitting: Family Medicine

## 2023-04-13 NOTE — Telephone Encounter (Signed)
Pt was called and informed. Pt was confused but said she will call for a refill next month.

## 2023-04-13 NOTE — Telephone Encounter (Signed)
Last refill sent 03/02/23 #9 and 1 refill. (9 per 30 days). This should last her through 05/02/23. Too soon to refill medication.

## 2023-04-13 NOTE — Telephone Encounter (Signed)
Pt request refill for rizatriptan (MAXALT) 10 MG tablet. Sent to [facility] [phone]

## 2023-04-15 ENCOUNTER — Other Ambulatory Visit: Payer: Self-pay

## 2023-04-15 MED ORDER — RIZATRIPTAN BENZOATE 10 MG PO TABS: 10.0000 mg | ORAL_TABLET | ORAL | 1 refills | Status: DC | PRN

## 2023-06-09 ENCOUNTER — Ambulatory Visit (INDEPENDENT_AMBULATORY_CARE_PROVIDER_SITE_OTHER): Payer: No Typology Code available for payment source | Admitting: Neurology

## 2023-06-09 DIAGNOSIS — G43009 Migraine without aura, not intractable, without status migrainosus: Secondary | ICD-10-CM

## 2023-06-09 DIAGNOSIS — G43719 Chronic migraine without aura, intractable, without status migrainosus: Secondary | ICD-10-CM | POA: Diagnosis not present

## 2023-06-09 MED ORDER — RIZATRIPTAN BENZOATE 10 MG PO TABS
10.0000 mg | ORAL_TABLET | ORAL | 11 refills | Status: DC | PRN
Start: 2023-06-09 — End: 2024-06-06

## 2023-06-09 MED ORDER — UBRELVY 100 MG PO TABS: 100.0000 mg | ORAL_TABLET | ORAL | 11 refills | Status: DC | PRN

## 2023-06-09 MED ORDER — ONABOTULINUMTOXINA 200 UNITS IJ SOLR
155.0000 [IU] | Freq: Once | INTRAMUSCULAR | Status: AC
Start: 2023-06-09 — End: ?
  Administered 2023-06-09: 155 [IU] via INTRAMUSCULAR

## 2023-06-09 NOTE — Progress Notes (Unsigned)
Consent Form Botulism Toxin Injection For Chronic Migraine  06/09/2023: This is her second botox. She has gotten > 50% improvement in migraine freq and severity. At baseline was having  migraines moderate to severe 15 days a month and daily headaches. Now she has 6 migraine days and < 10 total headache days. Refill ubrelvy and rizatriptan. +a   Reviewed orally with patient, additionally signature is on file:  Botulism toxin has been approved by the Federal drug administration for treatment of chronic migraine. Botulism toxin does not cure chronic migraine and it may not be effective in some patients.  The administration of botulism toxin is accomplished by injecting a small amount of toxin into the muscles of the neck and head. Dosage must be titrated for each individual. Any benefits resulting from botulism toxin tend to wear off after 3 months with a repeat injection required if benefit is to be maintained. Injections are usually done every 3-4 months with maximum effect peak achieved by about 2 or 3 weeks. Botulism toxin is expensive and you should be sure of what costs you will incur resulting from the injection.  The side effects of botulism toxin use for chronic migraine may include:   -Transient, and usually mild, facial weakness with facial injections  -Transient, and usually mild, head or neck weakness with head/neck injections  -Reduction or loss of forehead facial animation due to forehead muscle weakness  -Eyelid drooping  -Dry eye  -Pain at the site of injection or bruising at the site of injection  -Double vision  -Potential unknown long term risks  Contraindications: You should not have Botox if you are pregnant, nursing, allergic to albumin, have an infection, skin condition, or muscle weakness at the site of the injection, or have myasthenia gravis, Lambert-Eaton syndrome, or ALS.  It is also possible that as with any injection, there may be an allergic reaction or no effect  from the medication. Reduced effectiveness after repeated injections is sometimes seen and rarely infection at the injection site may occur. All care will be taken to prevent these side effects. If therapy is given over a long time, atrophy and wasting in the muscle injected may occur. Occasionally the patient's become refractory to treatment because they develop antibodies to the toxin. In this event, therapy needs to be modified.  I have read the above information and consent to the administration of botulism toxin.    BOTOX PROCEDURE NOTE FOR MIGRAINE HEADACHE    Contraindications and precautions discussed with patient(above). Aseptic procedure was observed and patient tolerated procedure. Procedure performed by Dr. Artemio Aly  The condition has existed for more than 6 months, and pt does not have a diagnosis of ALS, Myasthenia Gravis or Lambert-Eaton Syndrome.  Risks and benefits of injections discussed and pt agrees to proceed with the procedure.  Written consent obtained  These injections are medically necessary. Pt  receives good benefits from these injections. These injections do not cause sedations or hallucinations which the oral therapies may cause.  Description of procedure:  The patient was placed in a sitting position. The standard protocol was used for Botox as follows, with 5 units of Botox injected at each site:   -Procerus muscle, midline injection  -Corrugator muscle, bilateral injection  -Frontalis muscle, bilateral injection, with 2 sites each side, medial injection was performed in the upper one third of the frontalis muscle, in the region vertical from the medial inferior edge of the superior orbital rim. The lateral injection was again in  the upper one third of the forehead vertically above the lateral limbus of the cornea, 1.5 cm lateral to the medial injection site.  -Temporalis muscle injection, 4 sites, bilaterally. The first injection was 3 cm above the tragus  of the ear, second injection site was 1.5 cm to 3 cm up from the first injection site in line with the tragus of the ear. The third injection site was 1.5-3 cm forward between the first 2 injection sites. The fourth injection site was 1.5 cm posterior to the second injection site.   -Occipitalis muscle injection, 3 sites, bilaterally. The first injection was done one half way between the occipital protuberance and the tip of the mastoid process behind the ear. The second injection site was done lateral and superior to the first, 1 fingerbreadth from the first injection. The third injection site was 1 fingerbreadth superiorly and medially from the first injection site.  -Cervical paraspinal muscle injection, 2 sites, bilateral knee first injection site was 1 cm from the midline of the cervical spine, 3 cm inferior to the lower border of the occipital protuberance. The second injection site was 1.5 cm superiorly and laterally to the first injection site.  -Trapezius muscle injection was performed at 3 sites, bilaterally. The first injection site was in the upper trapezius muscle halfway between the inflection point of the neck, and the acromion. The second injection site was one half way between the acromion and the first injection site. The third injection was done between the first injection site and the inflection point of the neck.   Will return for repeat injection in 3 months.   200 units of Botox was used, any Botox not injected was wasted. The patient tolerated the procedure well, there were no complications of the above procedure.

## 2023-06-09 NOTE — Progress Notes (Unsigned)
Botox- 200 units x 1 vial Lot: [id number] Expiration: 04/2025 NDC: 0865-7846-96   Bacteriostatic 0.9% Sodium Chloride- 4 mL  Lot: [id number] Expiration: 01/19/2024 NDC: 1324-4010-27   Dx: O53.664 SP Witnessed by: Alveria Apley

## 2023-06-18 ENCOUNTER — Other Ambulatory Visit: Payer: Self-pay

## 2023-08-27 ENCOUNTER — Other Ambulatory Visit: Payer: Self-pay

## 2023-08-28 ENCOUNTER — Other Ambulatory Visit: Payer: Self-pay

## 2023-08-28 NOTE — Progress Notes (Signed)
Specialty Pharmacy Refill Coordination Note  Wendy Chang is a 22 y.o. female contacted today regarding refills of specialty medication(s) Onabotulinumtoxina   Patient requested Courier to Provider Office   Delivery date: 09/01/23   Verified address: [facility] [address]   Medication will be filled on 08/31/23.

## 2023-09-09 ENCOUNTER — Telehealth: Payer: Self-pay | Admitting: Neurology

## 2023-09-09 ENCOUNTER — Ambulatory Visit (INDEPENDENT_AMBULATORY_CARE_PROVIDER_SITE_OTHER): Payer: No Typology Code available for payment source | Admitting: Neurology

## 2023-09-09 DIAGNOSIS — G43719 Chronic migraine without aura, intractable, without status migrainosus: Secondary | ICD-10-CM

## 2023-09-09 MED ORDER — ONABOTULINUMTOXINA 200 UNITS IJ SOLR
155.0000 [IU] | Freq: Once | INTRAMUSCULAR | Status: AC
Start: 2023-09-09 — End: 2023-09-09
  Administered 2023-09-09: 155 [IU] via INTRAMUSCULAR

## 2023-09-09 NOTE — Progress Notes (Signed)
Consent Form Botulism Toxin Injection For Chronic Migraine  09/09/2023: This is her second botox. She has gotten > 50% improvement in migraine freq and severity. At baseline was having  migraines moderate to severe 15 days a month and daily headaches. Now she has 6 migraine days and < 10 total headache days.  Patient feels that her migraines cause her jaw to ache in her jaw aching also can cause her migraines to worsen it is a trigger for migraines included 5 units in each masseter to see if that helps.  06/09/2023: This is her second botox. She has gotten > 50% improvement in migraine freq and severity. At baseline was having  migraines moderate to severe 15 days a month and daily headaches. Now she has 6 migraine days and < 10 total headache days. Refill ubrelvy and rizatriptan. +a   Reviewed orally with patient, additionally signature is on file:  Botulism toxin has been approved by the Federal drug administration for treatment of chronic migraine. Botulism toxin does not cure chronic migraine and it may not be effective in some patients.  The administration of botulism toxin is accomplished by injecting a small amount of toxin into the muscles of the neck and head. Dosage must be titrated for each individual. Any benefits resulting from botulism toxin tend to wear off after 3 months with a repeat injection required if benefit is to be maintained. Injections are usually done every 3-4 months with maximum effect peak achieved by about 2 or 3 weeks. Botulism toxin is expensive and you should be sure of what costs you will incur resulting from the injection.  The side effects of botulism toxin use for chronic migraine may include:   -Transient, and usually mild, facial weakness with facial injections  -Transient, and usually mild, head or neck weakness with head/neck injections  -Reduction or loss of forehead facial animation due to forehead muscle weakness  -Eyelid drooping  -Dry eye  -Pain at  the site of injection or bruising at the site of injection  -Double vision  -Potential unknown long term risks  Contraindications: You should not have Botox if you are pregnant, nursing, allergic to albumin, have an infection, skin condition, or muscle weakness at the site of the injection, or have myasthenia gravis, Lambert-Eaton syndrome, or ALS.  It is also possible that as with any injection, there may be an allergic reaction or no effect from the medication. Reduced effectiveness after repeated injections is sometimes seen and rarely infection at the injection site may occur. All care will be taken to prevent these side effects. If therapy is given over a long time, atrophy and wasting in the muscle injected may occur. Occasionally the patient's become refractory to treatment because they develop antibodies to the toxin. In this event, therapy needs to be modified.  I have read the above information and consent to the administration of botulism toxin.    BOTOX PROCEDURE NOTE FOR MIGRAINE HEADACHE    Contraindications and precautions discussed with patient(above). Aseptic procedure was observed and patient tolerated procedure. Procedure performed by Dr. Artemio Aly  The condition has existed for more than 6 months, and pt does not have a diagnosis of ALS, Myasthenia Gravis or Lambert-Eaton Syndrome.  Risks and benefits of injections discussed and pt agrees to proceed with the procedure.  Written consent obtained  These injections are medically necessary. Pt  receives good benefits from these injections. These injections do not cause sedations or hallucinations which the oral therapies may cause.  Description of procedure:  The patient was placed in a sitting position. The standard protocol was used for Botox as follows, with 5 units of Botox injected at each site:   -Procerus muscle, midline injection  -Corrugator muscle, bilateral injection  -Frontalis muscle, bilateral injection,  with 2 sites each side, medial injection was performed in the upper one third of the frontalis muscle, in the region vertical from the medial inferior edge of the superior orbital rim. The lateral injection was again in the upper one third of the forehead vertically above the lateral limbus of the cornea, 1.5 cm lateral to the medial injection site.  -Temporalis muscle injection, 4 sites, bilaterally. The first injection was 3 cm above the tragus of the ear, second injection site was 1.5 cm to 3 cm up from the first injection site in line with the tragus of the ear. The third injection site was 1.5-3 cm forward between the first 2 injection sites. The fourth injection site was 1.5 cm posterior to the second injection site.   -Occipitalis muscle injection, 3 sites, bilaterally. The first injection was done one half way between the occipital protuberance and the tip of the mastoid process behind the ear. The second injection site was done lateral and superior to the first, 1 fingerbreadth from the first injection. The third injection site was 1 fingerbreadth superiorly and medially from the first injection site.  -Cervical paraspinal muscle injection, 2 sites, bilateral knee first injection site was 1 cm from the midline of the cervical spine, 3 cm inferior to the lower border of the occipital protuberance. The second injection site was 1.5 cm superiorly and laterally to the first injection site.  -Trapezius muscle injection was performed at 3 sites, bilaterally. The first injection site was in the upper trapezius muscle halfway between the inflection point of the neck, and the acromion. The second injection site was one half way between the acromion and the first injection site. The third injection was done between the first injection site and the inflection point of the neck.   Will return for repeat injection in 3 months.   200 units of Botox was used, any Botox not injected was wasted. The patient  tolerated the procedure well, there were no complications of the above procedure.

## 2023-09-09 NOTE — Progress Notes (Signed)
Botox- 200 units x 1 vial Lot: [id number] Expiration: 11/2025 NDC: 0865-7846-96  Bacteriostatic 0.9% Sodium Chloride- 4 mL  Lot: [id number] Expiration: 01/19/2024 NDC: 1324-4010-27  Dx: O53.664 S/P  Witnessed by Delmer Islam

## 2023-09-09 NOTE — Telephone Encounter (Signed)
Pt is requesting EOB's for all previous Botox appointments so she can submit them for the Botox savings program. Pt requested for them to be mailed to [address] [place], [place] [address].

## 2023-10-09 ENCOUNTER — Other Ambulatory Visit (HOSPITAL_COMMUNITY): Payer: Self-pay

## 2023-11-06 ENCOUNTER — Other Ambulatory Visit: Payer: Self-pay | Admitting: Neurology

## 2023-11-09 ENCOUNTER — Other Ambulatory Visit: Payer: Self-pay

## 2023-11-10 ENCOUNTER — Encounter: Payer: Self-pay | Admitting: Neurology

## 2023-11-19 ENCOUNTER — Other Ambulatory Visit: Payer: Self-pay

## 2023-11-19 ENCOUNTER — Other Ambulatory Visit: Payer: Self-pay | Admitting: Pharmacy Technician

## 2023-11-19 ENCOUNTER — Other Ambulatory Visit (HOSPITAL_COMMUNITY): Payer: Self-pay

## 2023-11-19 ENCOUNTER — Other Ambulatory Visit: Payer: Self-pay | Admitting: Family Medicine

## 2023-11-19 DIAGNOSIS — G43719 Chronic migraine without aura, intractable, without status migrainosus: Secondary | ICD-10-CM

## 2023-11-19 MED ORDER — BOTOX 200 UNITS IJ SOLR
INTRAMUSCULAR | 2 refills | Status: DC
  Filled 2023-11-19: qty 1, 84d supply, fill #0
  Filled 2024-02-03: qty 1, 84d supply, fill #1

## 2023-11-19 NOTE — Progress Notes (Signed)
Specialty Pharmacy Refill Coordination Note  Wendy Chang is a 23 y.o. female contacted today regarding refills of specialty medication(s) OnabotulinumtoxinA (BOTOX)   Patient requested Courier to Provider Office   Delivery date: 11/26/23   Verified address: [facility] [address]   Medication will be filled on 11/25/23.  RR sent to MD.

## 2023-11-25 ENCOUNTER — Other Ambulatory Visit: Payer: Self-pay

## 2023-11-25 ENCOUNTER — Telehealth: Payer: Self-pay | Admitting: Neurology

## 2023-11-25 NOTE — Telephone Encounter (Signed)
 Submitted Botox  auth renewal request via CMM, pt was approved.  Auth#: [id number] (10/26/23-11/24/24)

## 2023-12-03 ENCOUNTER — Ambulatory Visit (INDEPENDENT_AMBULATORY_CARE_PROVIDER_SITE_OTHER): Payer: No Typology Code available for payment source | Admitting: Neurology

## 2023-12-03 DIAGNOSIS — G43719 Chronic migraine without aura, intractable, without status migrainosus: Secondary | ICD-10-CM

## 2023-12-03 MED ORDER — ONABOTULINUMTOXINA 200 UNITS IJ SOLR
155.0000 [IU] | Freq: Once | INTRAMUSCULAR | Status: AC
Start: 2023-12-03 — End: 2023-12-03
  Administered 2023-12-03: 155 [IU] via INTRAMUSCULAR

## 2023-12-03 NOTE — Progress Notes (Signed)
Consent Form Botulism Toxin Injection For Chronic Migraine  12/03/2023 3rd botox doing great > 70% improvement in migraine and jeadache frequency/severity: This is her second botox. She has gotten > 50% improvement in migraine freq and severity. At baseline was having  migraines moderate to severe 15 days a month and daily headaches. Now she has 6 migraine days and < 10 total headache days.   06/09/2023: This is her second botox. She has gotten > 50% improvement in migraine freq and severity. At baseline was having  migraines moderate to severe 15 days a month and daily headaches. Now she has 6 migraine days and < 10 total headache days. Refill ubrelvy and rizatriptan. +a   Reviewed orally with patient, additionally signature is on file:  Botulism toxin has been approved by the Federal drug administration for treatment of chronic migraine. Botulism toxin does not cure chronic migraine and it may not be effective in some patients.  The administration of botulism toxin is accomplished by injecting a small amount of toxin into the muscles of the neck and head. Dosage must be titrated for each individual. Any benefits resulting from botulism toxin tend to wear off after 3 months with a repeat injection required if benefit is to be maintained. Injections are usually done every 3-4 months with maximum effect peak achieved by about 2 or 3 weeks. Botulism toxin is expensive and you should be sure of what costs you will incur resulting from the injection.  The side effects of botulism toxin use for chronic migraine may include:   -Transient, and usually mild, facial weakness with facial injections  -Transient, and usually mild, head or neck weakness with head/neck injections  -Reduction or loss of forehead facial animation due to forehead muscle weakness  -Eyelid drooping  -Dry eye  -Pain at the site of injection or bruising at the site of injection  -Double vision  -Potential unknown long term  risks  Contraindications: You should not have Botox if you are pregnant, nursing, allergic to albumin, have an infection, skin condition, or muscle weakness at the site of the injection, or have myasthenia gravis, Lambert-Eaton syndrome, or ALS.  It is also possible that as with any injection, there may be an allergic reaction or no effect from the medication. Reduced effectiveness after repeated injections is sometimes seen and rarely infection at the injection site may occur. All care will be taken to prevent these side effects. If therapy is given over a long time, atrophy and wasting in the muscle injected may occur. Occasionally the patient's become refractory to treatment because they develop antibodies to the toxin. In this event, therapy needs to be modified.  I have read the above information and consent to the administration of botulism toxin.    BOTOX PROCEDURE NOTE FOR MIGRAINE HEADACHE    Contraindications and precautions discussed with patient(above). Aseptic procedure was observed and patient tolerated procedure. Procedure performed by Dr. Artemio Aly  The condition has existed for more than 6 months, and pt does not have a diagnosis of ALS, Myasthenia Gravis or Lambert-Eaton Syndrome.  Risks and benefits of injections discussed and pt agrees to proceed with the procedure.  Written consent obtained  These injections are medically necessary. Pt  receives good benefits from these injections. These injections do not cause sedations or hallucinations which the oral therapies may cause.  Description of procedure:  The patient was placed in a sitting position. The standard protocol was used for Botox as follows, with 5 units of  Botox injected at each site:   -Procerus muscle, midline injection  -Corrugator muscle, bilateral injection  -Frontalis muscle, bilateral injection, with 2 sites each side, medial injection was performed in the upper one third of the frontalis muscle, in  the region vertical from the medial inferior edge of the superior orbital rim. The lateral injection was again in the upper one third of the forehead vertically above the lateral limbus of the cornea, 1.5 cm lateral to the medial injection site.  -Temporalis muscle injection, 4 sites, bilaterally. The first injection was 3 cm above the tragus of the ear, second injection site was 1.5 cm to 3 cm up from the first injection site in line with the tragus of the ear. The third injection site was 1.5-3 cm forward between the first 2 injection sites. The fourth injection site was 1.5 cm posterior to the second injection site.   -Occipitalis muscle injection, 3 sites, bilaterally. The first injection was done one half way between the occipital protuberance and the tip of the mastoid process behind the ear. The second injection site was done lateral and superior to the first, 1 fingerbreadth from the first injection. The third injection site was 1 fingerbreadth superiorly and medially from the first injection site.  -Cervical paraspinal muscle injection, 2 sites, bilateral knee first injection site was 1 cm from the midline of the cervical spine, 3 cm inferior to the lower border of the occipital protuberance. The second injection site was 1.5 cm superiorly and laterally to the first injection site.  -Trapezius muscle injection was performed at 3 sites, bilaterally. The first injection site was in the upper trapezius muscle halfway between the inflection point of the neck, and the acromion. The second injection site was one half way between the acromion and the first injection site. The third injection was done between the first injection site and the inflection point of the neck.   Will return for repeat injection in 3 months.   200 units of Botox was used, any Botox not injected was wasted. The patient tolerated the procedure well, there were no complications of the above procedure.

## 2023-12-03 NOTE — Progress Notes (Signed)
Botox- 200 units x 1 vial Lot: [id number] Expiration: 01/2026 NDC: 6578-4696-29  Bacteriostatic 0.9% Sodium Chloride- * mL  Lot: [id number] Expiration: 08/20/2024 NDC: 2440-1027-25  Dx: 43.709 S/P  Witnessed by Lennie Muckle, RN

## 2023-12-03 NOTE — Patient Instructions (Addendum)
Somnilight or theraspecs fl-41 tinted glasses Form to tint car windows Allay lamp  For light sensitivity

## 2024-01-28 ENCOUNTER — Telehealth: Payer: Self-pay | Admitting: Neurology

## 2024-01-28 NOTE — Telephone Encounter (Signed)
 Pt's mother said daughter having a migraine so bad she might pass out and need a cocktail injection and need to see Dr. Lucia Gaskins. Informed mother Dr. Lucia Gaskins did not have anything available for today. Ms. Wendy Chang said I will come by the office anywhere. Notified front desk and Dr. Lucia Gaskins Informed Dr. Lucia Gaskins of this, we discussed with Wendy Millet, NP. She instructed call Wendy Chang back and inform her Dr. Lucia Gaskins has recommended to take patient to Urgent Care.  Called Ms.  Chang back informed her Dr. Lucia Gaskins recommended to take patient to Urgent Care. Wendy Chang said ok on my way to Urgent Care.

## 2024-02-03 ENCOUNTER — Other Ambulatory Visit: Payer: Self-pay

## 2024-02-03 NOTE — Progress Notes (Signed)
 Specialty Pharmacy Refill Coordination Note  Wendy Chang is a 23 y.o. female contacted today regarding refills of specialty medication(s) OnabotulinumtoxinA (Botox)   Patient requested (Patient-Rptd) Delivery   Delivery date: 02/18/24   Verified address: (Patient-Rptd) [facility]   Medication will be filled on 04.30.25

## 2024-02-10 ENCOUNTER — Other Ambulatory Visit (HOSPITAL_COMMUNITY): Payer: Self-pay

## 2024-02-12 ENCOUNTER — Telehealth: Payer: Self-pay

## 2024-02-12 ENCOUNTER — Other Ambulatory Visit (HOSPITAL_COMMUNITY): Payer: Self-pay

## 2024-02-12 NOTE — Telephone Encounter (Signed)
 Pharmacy Patient Advocate Encounter  Received notification from [facility] that Prior Authorization for Ubrelvy  100MG  tablets has been APPROVED from 02/12/2024 to 02/11/2025   PA #/Case ID/Reference #: PA Case ID #: [id number]

## 2024-02-17 ENCOUNTER — Other Ambulatory Visit: Payer: Self-pay

## 2024-02-17 ENCOUNTER — Encounter: Payer: Self-pay | Admitting: Neurology

## 2024-02-18 NOTE — Telephone Encounter (Signed)
 [facility] reached out to me stating that pt's insurance was now rejecting their claim to fill. I called [facility] and spoke with Moira Andrews in the benefits review department. She told me that even though we had a prior authorization on file through 11/2024, Botox  was no longer a covered benefit through them and would not be approved.   Called [employer] to see if I could get her approved for B/B, I was able to complete auth over the phone and received approval. Pt will have a 20% coinsurance until OOP is met.   Auth#: [id number] (02/17/24-08/18/24)

## 2024-02-19 ENCOUNTER — Other Ambulatory Visit: Payer: Self-pay

## 2024-02-23 ENCOUNTER — Other Ambulatory Visit: Payer: Self-pay

## 2024-02-23 NOTE — Progress Notes (Signed)
 Medication needed PA, but is no longer covered with pharmacy benefit. Patient must use buy and bill. Disenrolling.

## 2024-02-24 ENCOUNTER — Other Ambulatory Visit: Payer: Self-pay

## 2024-02-25 ENCOUNTER — Ambulatory Visit: Payer: No Typology Code available for payment source | Admitting: Neurology

## 2024-02-25 DIAGNOSIS — G43719 Chronic migraine without aura, intractable, without status migrainosus: Secondary | ICD-10-CM

## 2024-02-25 DIAGNOSIS — R112 Nausea with vomiting, unspecified: Secondary | ICD-10-CM

## 2024-02-25 MED ORDER — SUMATRIPTAN SUCCINATE 6 MG/0.5ML ~~LOC~~ SOLN: 6.0000 mg | SUBCUTANEOUS | 6 refills | Status: DC | PRN

## 2024-02-25 MED ORDER — ONABOTULINUMTOXINA 200 UNITS IJ SOLR
155.0000 [IU] | Freq: Once | INTRAMUSCULAR | Status: AC
  Administered 2024-02-25: 155 [IU] via INTRAMUSCULAR

## 2024-02-25 MED ORDER — PROMETHAZINE HCL 25 MG RE SUPP
25.0000 mg | Freq: Four times a day (QID) | RECTAL | 0 refills | Status: AC | PRN
Start: 2024-02-25 — End: ?

## 2024-02-25 NOTE — Progress Notes (Signed)
 Botox - 200 units x 1 vial Lot: [id number] Expiration: 01/2026 NDC: 2952-8413-24  Bacteriostatic 0.9% Sodium Chloride- 5 mL  Lot: [id number] Expiration: 08/20/2024 NDC: 2536-6440-34  Dx: V42.595 B/B Witnessed by Inocencio Mania, RN

## 2024-02-25 NOTE — Progress Notes (Signed)
 Consent Form Botulism Toxin Injection For Chronic Migraine  02/25/2024: stable Order phenergan and imitrex injections. Tried tosymra and generic imitrex nasal spray made her legs weak will try injections for acute onset migraines Gave zavzpret samples x2 ask if she tried it A little higher in the forehead? For morning headaches  12/03/2023 3rd botox  doing great > 70% improvement in migraine and jeadache frequency/severity:  09/09/2023: This is her second botox . She has gotten > 50% improvement in migraine freq and severity. At baseline was having  migraines moderate to severe 15 days a month and daily headaches. Now she has 6 migraine days and < 10 total headache days.   06/09/2023: This is her second botox . She has gotten > 50% improvement in migraine freq and severity. At baseline was having  migraines moderate to severe 15 days a month and daily headaches. Now she has 6 migraine days and < 10 total headache days. Refill ubrelvy  and rizatriptan . +a   Reviewed orally with patient, additionally signature is on file:  Botulism toxin has been approved by the Federal drug administration for treatment of chronic migraine. Botulism toxin does not cure chronic migraine and it may not be effective in some patients.  The administration of botulism toxin is accomplished by injecting a small amount of toxin into the muscles of the neck and head. Dosage must be titrated for each individual. Any benefits resulting from botulism toxin tend to wear off after 3 months with a repeat injection required if benefit is to be maintained. Injections are usually done every 3-4 months with maximum effect peak achieved by about 2 or 3 weeks. Botulism toxin is expensive and you should be sure of what costs you will incur resulting from the injection.  The side effects of botulism toxin use for chronic migraine may include:   -Transient, and usually mild, facial weakness with facial injections  -Transient, and usually mild,  head or neck weakness with head/neck injections  -Reduction or loss of forehead facial animation due to forehead muscle weakness  -Eyelid drooping  -Dry eye  -Pain at the site of injection or bruising at the site of injection  -Double vision  -Potential unknown long term risks  Contraindications: You should not have Botox  if you are pregnant, nursing, allergic to albumin, have an infection, skin condition, or muscle weakness at the site of the injection, or have myasthenia gravis, Lambert-Eaton syndrome, or ALS.  It is also possible that as with any injection, there may be an allergic reaction or no effect from the medication. Reduced effectiveness after repeated injections is sometimes seen and rarely infection at the injection site may occur. All care will be taken to prevent these side effects. If therapy is given over a long time, atrophy and wasting in the muscle injected may occur. Occasionally the patient's become refractory to treatment because they develop antibodies to the toxin. In this event, therapy needs to be modified.  I have read the above information and consent to the administration of botulism toxin.    BOTOX  PROCEDURE NOTE FOR MIGRAINE HEADACHE    Contraindications and precautions discussed with patient(above). Aseptic procedure was observed and patient tolerated procedure. Procedure performed by Dr. Criselda Dolly  The condition has existed for more than 6 months, and pt does not have a diagnosis of ALS, Myasthenia Gravis or Lambert-Eaton Syndrome.  Risks and benefits of injections discussed and pt agrees to proceed with the procedure.  Written consent obtained  These injections are medically necessary. Pt  receives good benefits from these injections. These injections do not cause sedations or hallucinations which the oral therapies may cause.  Description of procedure:  The patient was placed in a sitting position. The standard protocol was used for Botox  as follows,  with 5 units of Botox  injected at each site:   -Procerus muscle, midline injection  -Corrugator muscle, bilateral injection  -Frontalis muscle, bilateral injection, with 2 sites each side, medial injection was performed in the upper one third of the frontalis muscle, in the region vertical from the medial inferior edge of the superior orbital rim. The lateral injection was again in the upper one third of the forehead vertically above the lateral limbus of the cornea, 1.5 cm lateral to the medial injection site.  -Temporalis muscle injection, 4 sites, bilaterally. The first injection was 3 cm above the tragus of the ear, second injection site was 1.5 cm to 3 cm up from the first injection site in line with the tragus of the ear. The third injection site was 1.5-3 cm forward between the first 2 injection sites. The fourth injection site was 1.5 cm posterior to the second injection site.   -Occipitalis muscle injection, 3 sites, bilaterally. The first injection was done one half way between the occipital protuberance and the tip of the mastoid process behind the ear. The second injection site was done lateral and superior to the first, 1 fingerbreadth from the first injection. The third injection site was 1 fingerbreadth superiorly and medially from the first injection site.  -Cervical paraspinal muscle injection, 2 sites, bilateral knee first injection site was 1 cm from the midline of the cervical spine, 3 cm inferior to the lower border of the occipital protuberance. The second injection site was 1.5 cm superiorly and laterally to the first injection site.  -Trapezius muscle injection was performed at 3 sites, bilaterally. The first injection site was in the upper trapezius muscle halfway between the inflection point of the neck, and the acromion. The second injection site was one half way between the acromion and the first injection site. The third injection was done between the first injection site  and the inflection point of the neck.   Will return for repeat injection in 3 months.   200 units of Botox  was used, any Botox  not injected was wasted. The patient tolerated the procedure well, there were no complications of the above procedure.

## 2024-03-01 ENCOUNTER — Telehealth: Payer: Self-pay | Admitting: Neurology

## 2024-03-01 NOTE — Telephone Encounter (Signed)
 Pt called in regards to getting a  remittance advice form for her botox . . Pt states that insurance told her to call for form. Tranfers to billing .

## 2024-03-15 ENCOUNTER — Other Ambulatory Visit: Payer: Self-pay | Admitting: Neurology

## 2024-03-15 DIAGNOSIS — G43719 Chronic migraine without aura, intractable, without status migrainosus: Secondary | ICD-10-CM

## 2024-03-22 ENCOUNTER — Other Ambulatory Visit: Payer: Self-pay | Admitting: *Deleted

## 2024-03-22 DIAGNOSIS — G43719 Chronic migraine without aura, intractable, without status migrainosus: Secondary | ICD-10-CM

## 2024-03-22 MED ORDER — SUMATRIPTAN SUCCINATE 6 MG/0.5ML ~~LOC~~ SOLN
6.0000 mg | SUBCUTANEOUS | 6 refills | Status: AC | PRN
Start: 2024-03-22 — End: ?

## 2024-05-02 NOTE — Telephone Encounter (Signed)
 [facility] auth#: [id number] (02/25/24-08/27/24)

## 2024-05-04 ENCOUNTER — Other Ambulatory Visit: Payer: Self-pay | Admitting: *Deleted

## 2024-05-04 DIAGNOSIS — G43719 Chronic migraine without aura, intractable, without status migrainosus: Secondary | ICD-10-CM

## 2024-05-04 MED ORDER — BOTOX 200 UNITS IJ SOLR: INTRAMUSCULAR | 2 refills | Status: AC

## 2024-06-02 ENCOUNTER — Other Ambulatory Visit: Payer: Self-pay | Admitting: Neurology

## 2024-06-02 DIAGNOSIS — G43719 Chronic migraine without aura, intractable, without status migrainosus: Secondary | ICD-10-CM

## 2024-06-02 NOTE — Telephone Encounter (Signed)
 Wait on refill f/u 06/06/2024 (Monday)

## 2024-06-06 ENCOUNTER — Ambulatory Visit: Admitting: Neurology

## 2024-06-08 ENCOUNTER — Other Ambulatory Visit: Payer: Self-pay | Admitting: Neurology

## 2024-06-08 DIAGNOSIS — G43009 Migraine without aura, not intractable, without status migrainosus: Secondary | ICD-10-CM

## 2024-06-10 ENCOUNTER — Ambulatory Visit (INDEPENDENT_AMBULATORY_CARE_PROVIDER_SITE_OTHER): Admitting: Neurology

## 2024-06-10 DIAGNOSIS — G43719 Chronic migraine without aura, intractable, without status migrainosus: Secondary | ICD-10-CM | POA: Diagnosis not present

## 2024-06-10 NOTE — Progress Notes (Signed)
 Consent Form Botulism Toxin Injection For Chronic Migraine  06/10/2024: doing great > 70% improvement in migraine and jeadache frequency/severity. Patient feels that her migraines cause her jaw to ache in her jaw aching also can cause her migraines to worsen it is a trigger for migraines included 10 units in each masseter to see if that helps with migraine severity. Also additional 5u each corrugators and procerus.   02/25/2024: stable Order phenergan  and imitrex  injections. Tried tosymra  and generic imitrex  nasal spray made her legs weak will try injections for acute onset migraines Gave zavzpret samples x2 ask if she tried it A little higher in the forehead? For morning headaches  12/03/2023 3rd botox  doing great > 70% improvement in migraine and jeadache frequency/severity:  09/09/2023: This is her second botox . She has gotten > 50% improvement in migraine freq and severity. At baseline was having  migraines moderate to severe 15 days a month and daily headaches. Now she has 6 migraine days and < 10 total headache days.   06/09/2023: This is her second botox . She has gotten > 50% improvement in migraine freq and severity. At baseline was having  migraines moderate to severe 15 days a month and daily headaches. Now she has 6 migraine days and < 10 total headache days. Refill ubrelvy  and rizatriptan . +a   Reviewed orally with patient, additionally signature is on file:  Botulism toxin has been approved by the Federal drug administration for treatment of chronic migraine. Botulism toxin does not cure chronic migraine and it may not be effective in some patients.  The administration of botulism toxin is accomplished by injecting a small amount of toxin into the muscles of the neck and head. Dosage must be titrated for each individual. Any benefits resulting from botulism toxin tend to wear off after 3 months with a repeat injection required if benefit is to be maintained. Injections are usually done  every 3-4 months with maximum effect peak achieved by about 2 or 3 weeks. Botulism toxin is expensive and you should be sure of what costs you will incur resulting from the injection.  The side effects of botulism toxin use for chronic migraine may include:   -Transient, and usually mild, facial weakness with facial injections  -Transient, and usually mild, head or neck weakness with head/neck injections  -Reduction or loss of forehead facial animation due to forehead muscle weakness  -Eyelid drooping  -Dry eye  -Pain at the site of injection or bruising at the site of injection  -Double vision  -Potential unknown long term risks  Contraindications: You should not have Botox  if you are pregnant, nursing, allergic to albumin, have an infection, skin condition, or muscle weakness at the site of the injection, or have myasthenia gravis, Lambert-Eaton syndrome, or ALS.  It is also possible that as with any injection, there may be an allergic reaction or no effect from the medication. Reduced effectiveness after repeated injections is sometimes seen and rarely infection at the injection site may occur. All care will be taken to prevent these side effects. If therapy is given over a long time, atrophy and wasting in the muscle injected may occur. Occasionally the patient's become refractory to treatment because they develop antibodies to the toxin. In this event, therapy needs to be modified.  I have read the above information and consent to the administration of botulism toxin.    BOTOX  PROCEDURE NOTE FOR MIGRAINE HEADACHE    Contraindications and precautions discussed with patient(above). Aseptic procedure was observed  and patient tolerated procedure. Procedure performed by Dr. Andree Epp  The condition has existed for more than 6 months, and pt does not have a diagnosis of ALS, Myasthenia Gravis or Lambert-Eaton Syndrome.  Risks and benefits of injections discussed and pt agrees to proceed  with the procedure.  Written consent obtained  These injections are medically necessary. Pt  receives good benefits from these injections. These injections do not cause sedations or hallucinations which the oral therapies may cause.  Description of procedure:  The patient was placed in a sitting position. The standard protocol was used for Botox  as follows, with 5 units of Botox  injected at each site:   -Procerus muscle, midline injection  -Corrugator muscle, bilateral injection  -Frontalis muscle, bilateral injection, with 2 sites each side, medial injection was performed in the upper one third of the frontalis muscle, in the region vertical from the medial inferior edge of the superior orbital rim. The lateral injection was again in the upper one third of the forehead vertically above the lateral limbus of the cornea, 1.5 cm lateral to the medial injection site.  -Temporalis muscle injection, 4 sites, bilaterally. The first injection was 3 cm above the tragus of the ear, second injection site was 1.5 cm to 3 cm up from the first injection site in line with the tragus of the ear. The third injection site was 1.5-3 cm forward between the first 2 injection sites. The fourth injection site was 1.5 cm posterior to the second injection site.   -Occipitalis muscle injection, 3 sites, bilaterally. The first injection was done one half way between the occipital protuberance and the tip of the mastoid process behind the ear. The second injection site was done lateral and superior to the first, 1 fingerbreadth from the first injection. The third injection site was 1 fingerbreadth superiorly and medially from the first injection site.  -Cervical paraspinal muscle injection, 2 sites, bilateral knee first injection site was 1 cm from the midline of the cervical spine, 3 cm inferior to the lower border of the occipital protuberance. The second injection site was 1.5 cm superiorly and laterally to the first  injection site.  -Trapezius muscle injection was performed at 3 sites, bilaterally. The first injection site was in the upper trapezius muscle halfway between the inflection point of the neck, and the acromion. The second injection site was one half way between the acromion and the first injection site. The third injection was done between the first injection site and the inflection point of the neck.   Will return for repeat injection in 3 months.   195 units of Botox  was used, 5 Botox  not injected was wasted. The patient tolerated the procedure well, there were no complications of the above procedure.

## 2024-06-12 MED ORDER — ONABOTULINUMTOXINA 100 UNITS IJ SOLR
195.0000 [IU] | Freq: Once | INTRAMUSCULAR | Status: AC
Start: 2024-06-12 — End: 2024-06-10
  Administered 2024-06-10: 195 [IU] via INTRAMUSCULAR

## 2024-07-04 ENCOUNTER — Telehealth: Payer: Self-pay | Admitting: Neurology

## 2024-07-04 NOTE — Telephone Encounter (Signed)
 I called pt and relayed via VM to call us  back or relay back thru mychart about how many headaches you are having per month now.  [facility] calling and needing to know.  Can state migraines  and headaches.

## 2024-07-04 NOTE — Telephone Encounter (Signed)
 Sarah from [facility] called to verify How many Headaches does Pt get per Month  .    You can call Pharmacy back at    [id number] - [place], [place] - [address] AT [facility] [address] (Ph: [phone])

## 2024-07-05 NOTE — Telephone Encounter (Signed)
 I called and let the [facility] pharmacy know that per pt, 15-20 headaches per month.  (Most of them migraines).  She said thank you.

## 2024-07-19 ENCOUNTER — Encounter: Payer: Self-pay | Admitting: Neurology

## 2024-07-19 ENCOUNTER — Other Ambulatory Visit: Payer: Self-pay | Admitting: Neurology

## 2024-07-19 DIAGNOSIS — G43719 Chronic migraine without aura, intractable, without status migrainosus: Secondary | ICD-10-CM

## 2024-07-19 NOTE — Telephone Encounter (Signed)
 Received request from [facility].

## 2024-07-21 ENCOUNTER — Telehealth: Payer: Self-pay | Admitting: Neurology

## 2024-07-21 NOTE — Telephone Encounter (Signed)
 Pt called needing a refill request for her rizatriptan  (MAXALT ) 10 MG tablet sent in to the [facility] on [address]   Pt would also like a call back to get her BOTOX  appt r/s due to provider not being available.

## 2024-07-21 NOTE — Telephone Encounter (Signed)
 Called and r/s appt with Amy, she has seen pt before. Pt is aware that Amy can inject masseters, but does not do any extra spots outside of migraine Botox  protocol.

## 2024-08-09 ENCOUNTER — Telehealth: Payer: Self-pay | Admitting: Family Medicine

## 2024-08-09 NOTE — Telephone Encounter (Signed)
 Submitted auth renewal request via CMM, status is pending. [id number]

## 2024-08-24 ENCOUNTER — Telehealth: Payer: Self-pay | Admitting: Neurology

## 2024-08-24 NOTE — Telephone Encounter (Signed)
 Tiffany from [employer] called to let me know shara was approved, she will also be faxing me the approval letter.   Auth#: [id number] (08/17/24-08/17/25)

## 2024-08-24 NOTE — Telephone Encounter (Signed)
 Tiffany (sports coach) from [employer] called to speak with Botox  coordinator re: Botox  for pt

## 2024-08-31 NOTE — Progress Notes (Deleted)
 08/31/24 ALL: Wendy Chang returns for Botox . She had last few procedures with Dr Ines.   01/01/2023 ALL: Wendy Chang presents for first Botox  procedure. She had continued Emgality , amitriptyline , Ubrelvy  and rizatriptan  but continues to have daily migraines. Insurance would not cover Emgality  or Ubrelvy . She has discontinued amitriptyline . Now using rizatriptan  and ondansetron  for abortive therapy.     Consent Form Botulism Toxin Injection For Chronic Migraine    Reviewed orally with patient, additionally signature is on file:  Botulism toxin has been approved by the Federal drug administration for treatment of chronic migraine. Botulism toxin does not cure chronic migraine and it may not be effective in some patients.  The administration of botulism toxin is accomplished by injecting a small amount of toxin into the muscles of the neck and head. Dosage must be titrated for each individual. Any benefits resulting from botulism toxin tend to wear off after 3 months with a repeat injection required if benefit is to be maintained. Injections are usually done every 3-4 months with maximum effect peak achieved by about 2 or 3 weeks. Botulism toxin is expensive and you should be sure of what costs you will incur resulting from the injection.  The side effects of botulism toxin use for chronic migraine may include:   -Transient, and usually mild, facial weakness with facial injections  -Transient, and usually mild, head or neck weakness with head/neck injections  -Reduction or loss of forehead facial animation due to forehead muscle weakness  -Eyelid drooping  -Dry eye  -Pain at the site of injection or bruising at the site of injection  -Double vision  -Potential unknown long term risks   Contraindications: You should not have Botox  if you are pregnant, nursing, allergic to albumin, have an infection, skin condition, or muscle weakness at the site of the injection, or have myasthenia gravis,  Lambert-Eaton syndrome, or ALS.  It is also possible that as with any injection, there may be an allergic reaction or no effect from the medication. Reduced effectiveness after repeated injections is sometimes seen and rarely infection at the injection site may occur. All care will be taken to prevent these side effects. If therapy is given over a long time, atrophy and wasting in the muscle injected may occur. Occasionally the patient's become refractory to treatment because they develop antibodies to the toxin. In this event, therapy needs to be modified.  I have read the above information and consent to the administration of botulism toxin.    BOTOX  PROCEDURE NOTE FOR MIGRAINE HEADACHE  Contraindications and precautions discussed with patient(above). Aseptic procedure was observed and patient tolerated procedure. Procedure performed by Greig Forbes, FNP-C.   The condition has existed for more than 6 months, and pt does not have a diagnosis of ALS, Myasthenia Gravis or Lambert-Eaton Syndrome.  Risks and benefits of injections discussed and pt agrees to proceed with the procedure.  Written consent obtained  These injections are medically necessary. Pt  receives good benefits from these injections. These injections do not cause sedations or hallucinations which the oral therapies may cause.   Description of procedure:  The patient was placed in a sitting position. The standard protocol was used for Botox  as follows, with 5 units of Botox  injected at each site:  -Procerus muscle, midline injection  -Corrugator muscle, bilateral injection  -Frontalis muscle, bilateral injection, with 2 sites each side, medial injection was performed in the upper one third of the frontalis muscle, in the region vertical from the medial  inferior edge of the superior orbital rim. The lateral injection was again in the upper one third of the forehead vertically above the lateral limbus of the cornea, 1.5 cm lateral  to the medial injection site.  -Temporalis muscle injection, 4 sites, bilaterally. The first injection was 3 cm above the tragus of the ear, second injection site was 1.5 cm to 3 cm up from the first injection site in line with the tragus of the ear. The third injection site was 1.5-3 cm forward between the first 2 injection sites. The fourth injection site was 1.5 cm posterior to the second injection site. 5th site laterally in the temporalis  muscleat the level of the outer canthus.  -Occipitalis muscle injection, 3 sites, bilaterally. The first injection was done one half way between the occipital protuberance and the tip of the mastoid process behind the ear. The second injection site was done lateral and superior to the first, 1 fingerbreadth from the first injection. The third injection site was 1 fingerbreadth superiorly and medially from the first injection site.  -Cervical paraspinal muscle injection, 2 sites, bilaterally. The first injection site was 1 cm from the midline of the cervical spine, 3 cm inferior to the lower border of the occipital protuberance. The second injection site was 1.5 cm superiorly and laterally to the first injection site.  -Trapezius muscle injection was performed at 3 sites, bilaterally. The first injection site was in the upper trapezius muscle halfway between the inflection point of the neck, and the acromion. The second injection site was one half way between the acromion and the first injection site. The third injection was done between the first injection site and the inflection point of the neck.   Will return for repeat injection in 3 months.   A total of 200 units of Botox  was prepared, 155 units of Botox  was injected as documented above, any Botox  not injected was wasted. The patient tolerated the procedure well, there were no complications of the above procedure.

## 2024-09-05 ENCOUNTER — Ambulatory Visit: Admitting: Family Medicine

## 2024-09-05 ENCOUNTER — Ambulatory Visit: Admitting: Neurology

## 2024-09-05 NOTE — Telephone Encounter (Signed)
 I have been going back and forth with [facility] because they keep getting a rejected claim for pt's Botox .   I called [facility] and the rep told me it needs to be processed through pt's medical benefit, [place]. When I submitted a PA request to [facility], they sent me a fax that Botox  is on the medical benefit lockout list and needs to be processed through [facility]. I left a VM with the case management team [phone]) asking for a call back to discuss.

## 2024-09-05 NOTE — Progress Notes (Signed)
 09/12/24 ALL: Wendy Chang returns for Botox . She had last few procedures with Dr Ines. She reports significant improvement in migraine intensity and frequency. Rizatriptan  usually works well for abortive therapy. Sumatriptan  injections used for intractable migraines. She requests masseter injections as clenching triggers migraine.   01/01/2023 ALL: Wendy Chang presents for first Botox  procedure. She had continued Emgality , amitriptyline , Ubrelvy  and rizatriptan  but continues to have daily migraines. Insurance would not cover Emgality  or Ubrelvy . She has discontinued amitriptyline . Now using rizatriptan  and ondansetron  for abortive therapy.     Consent Form Botulism Toxin Injection For Chronic Migraine    Reviewed orally with patient, additionally signature is on file:  Botulism toxin has been approved by the Federal drug administration for treatment of chronic migraine. Botulism toxin does not cure chronic migraine and it may not be effective in some patients.  The administration of botulism toxin is accomplished by injecting a small amount of toxin into the muscles of the neck and head. Dosage must be titrated for each individual. Any benefits resulting from botulism toxin tend to wear off after 3 months with a repeat injection required if benefit is to be maintained. Injections are usually done every 3-4 months with maximum effect peak achieved by about 2 or 3 weeks. Botulism toxin is expensive and you should be sure of what costs you will incur resulting from the injection.  The side effects of botulism toxin use for chronic migraine may include:   -Transient, and usually mild, facial weakness with facial injections  -Transient, and usually mild, head or neck weakness with head/neck injections  -Reduction or loss of forehead facial animation due to forehead muscle weakness  -Eyelid drooping  -Dry eye  -Pain at the site of injection or bruising at the site of injection  -Double  vision  -Potential unknown long term risks   Contraindications: You should not have Botox  if you are pregnant, nursing, allergic to albumin, have an infection, skin condition, or muscle weakness at the site of the injection, or have myasthenia gravis, Lambert-Eaton syndrome, or ALS.  It is also possible that as with any injection, there may be an allergic reaction or no effect from the medication. Reduced effectiveness after repeated injections is sometimes seen and rarely infection at the injection site may occur. All care will be taken to prevent these side effects. If therapy is given over a long time, atrophy and wasting in the muscle injected may occur. Occasionally the patient's become refractory to treatment because they develop antibodies to the toxin. In this event, therapy needs to be modified.  I have read the above information and consent to the administration of botulism toxin.    BOTOX  PROCEDURE NOTE FOR MIGRAINE HEADACHE  Contraindications and precautions discussed with patient(above). Aseptic procedure was observed and patient tolerated procedure. Procedure performed by Greig Forbes, FNP-C.   The condition has existed for more than 6 months, and pt does not have a diagnosis of ALS, Myasthenia Gravis or Lambert-Eaton Syndrome.  Risks and benefits of injections discussed and pt agrees to proceed with the procedure.  Written consent obtained  These injections are medically necessary. Pt  receives good benefits from these injections. These injections do not cause sedations or hallucinations which the oral therapies may cause.   Description of procedure:  The patient was placed in a sitting position. The standard protocol was used for Botox  as follows, with 5 units of Botox  injected at each site:  -Procerus muscle, midline injection  -Corrugator muscle, bilateral injection  -  Frontalis muscle, bilateral injection, with 2 sites each side, medial injection was performed in the upper  one third of the frontalis muscle, in the region vertical from the medial inferior edge of the superior orbital rim. The lateral injection was again in the upper one third of the forehead vertically above the lateral limbus of the cornea, 1.5 cm lateral to the medial injection site.  -Temporalis muscle injection, 4 sites, bilaterally. The first injection was 3 cm above the tragus of the ear, second injection site was 1.5 cm to 3 cm up from the first injection site in line with the tragus of the ear. The third injection site was 1.5-3 cm forward between the first 2 injection sites. The fourth injection site was 1.5 cm posterior to the second injection site. 5th site laterally in the temporalis  muscleat the level of the outer canthus.  -Occipitalis muscle injection, 3 sites, bilaterally. The first injection was done one half way between the occipital protuberance and the tip of the mastoid process behind the ear. The second injection site was done lateral and superior to the first, 1 fingerbreadth from the first injection. The third injection site was 1 fingerbreadth superiorly and medially from the first injection site.  -Cervical paraspinal muscle injection, 2 sites, bilaterally. The first injection site was 1 cm from the midline of the cervical spine, 3 cm inferior to the lower border of the occipital protuberance. The second injection site was 1.5 cm superiorly and laterally to the first injection site.  -Trapezius muscle injection was performed at 3 sites, bilaterally. The first injection site was in the upper trapezius muscle halfway between the inflection point of the neck, and the acromion. The second injection site was one half way between the acromion and the first injection site. The third injection was done between the first injection site and the inflection point of the neck.  -Masseter muscle injections bilaterally    Will return for repeat injection in 3 months.   A total of 200 units  of Botox  was prepared, 165 units of Botox  was injected as documented above, any Botox  not injected was wasted. The patient tolerated the procedure well, there were no complications of the above procedure.

## 2024-09-08 NOTE — Telephone Encounter (Signed)
 I had a 3 way phone call between Tiffany @ [facility] and [facility]. We were able to get the issues resolved and delivery scheduled for 11/25.

## 2024-09-12 ENCOUNTER — Ambulatory Visit (INDEPENDENT_AMBULATORY_CARE_PROVIDER_SITE_OTHER): Admitting: Family Medicine

## 2024-09-12 ENCOUNTER — Encounter: Payer: Self-pay | Admitting: Family Medicine

## 2024-09-12 VITALS — BP 118/85

## 2024-09-12 DIAGNOSIS — G43719 Chronic migraine without aura, intractable, without status migrainosus: Secondary | ICD-10-CM | POA: Diagnosis not present

## 2024-09-12 MED ORDER — ONABOTULINUMTOXINA 200 UNITS IJ SOLR
155.0000 [IU] | Freq: Once | INTRAMUSCULAR | Status: AC
  Administered 2024-09-12: 165 [IU] via INTRAMUSCULAR

## 2024-09-12 NOTE — Progress Notes (Signed)
 Botox - 200 units x 1 vial Lot: [id number] Expiration: 12/2026 NDC: 9976-6078-97  Bacteriostatic 0.9% Sodium Chloride- 4 mL  Lot: [id number] Expiration: OCT-31-2026 NDC: 9590-8033-97  Dx:G43.719 S/P Witnessed by: Robie Meissner, CMA

## 2024-12-12 ENCOUNTER — Ambulatory Visit: Admitting: Family Medicine
